# Patient Record
Sex: Male | Born: 1975 | Race: White | Hispanic: No | Marital: Married | Smoking: Never smoker
Health system: Southern US, Community
[De-identification: ages and names within clinical notes are randomized; demographics above are authoritative.]

## PROBLEM LIST (undated history)

## (undated) DIAGNOSIS — E785 Hyperlipidemia, unspecified: Secondary | ICD-10-CM

## (undated) DIAGNOSIS — I10 Essential (primary) hypertension: Secondary | ICD-10-CM

---

## 2008-10-13 ENCOUNTER — Ambulatory Visit: Payer: Self-pay | Admitting: Occupational Medicine

## 2008-10-13 DIAGNOSIS — L03019 Cellulitis of unspecified finger: Secondary | ICD-10-CM | POA: Insufficient documentation

## 2015-09-10 ENCOUNTER — Encounter: Payer: Self-pay | Admitting: Emergency Medicine

## 2015-09-10 ENCOUNTER — Emergency Department (INDEPENDENT_AMBULATORY_CARE_PROVIDER_SITE_OTHER)
Admission: EM | Admit: 2015-09-10 | Discharge: 2015-09-10 | Disposition: A | Discharge status: Home / Self Care | Payer: BLUE CROSS/BLUE SHIELD | Source: Home / Self Care | Attending: Family Medicine | Admitting: Family Medicine

## 2015-09-10 DIAGNOSIS — J069 Acute upper respiratory infection, unspecified: Secondary | ICD-10-CM

## 2015-09-10 MED ORDER — AZITHROMYCIN 250 MG PO TABS: 250.0000 mg | ORAL_TABLET | Freq: Every day | ORAL | Status: DC

## 2015-09-10 NOTE — Discharge Instructions (Signed)
You may take 400-629m Ibuprofen (Motrin) every 6-8 hours for fever and pain  Alternate with Tylenol  You may take 5037mTylenol every 4-6 hours as needed for fever and pain  Follow-up with your primary care provider next week for recheck of symptoms if not improving.  Be sure to drink plenty of fluids and rest, at least 8hrs of sleep a night, preferably more while you are sick. Return urgent care or go to closest ER if you cannot keep down fluids/signs of dehydration, fever not reducing with Tylenol, difficulty breathing/wheezing, stiff neck, worsening condition, or other concerns (see below)  Please take antibiotics as prescribed and be sure to complete entire course even if you start to feel better to ensure infection does not come back.   Cool Mist Vaporizers Vaporizers may help relieve the symptoms of a cough and cold. They add moisture to the air, which helps mucus to become thinner and less sticky. This makes it easier to breathe and cough up secretions. Cool mist vaporizers do not cause serious burns like hot mist vaporizers, which may also be called steamers or humidifiers. Vaporizers have not been proven to help with colds. You should not use a vaporizer if you are allergic to mold. HOME CARE INSTRUCTIONS  Follow the package instructions for the vaporizer.  Do not use anything other than distilled water in the vaporizer.  Do not run the vaporizer all of the time. This can cause mold or bacteria to grow in the vaporizer.  Clean the vaporizer after each time it is used.  Clean and dry the vaporizer well before storing it.  Stop using the vaporizer if worsening respiratory symptoms develop.   This information is not intended to replace advice given to you by your health care provider. Make sure you discuss any questions you have with your health care provider.   Document Released: 03/15/2004 Document Revised: 06/23/2013 Document Reviewed: 11/05/2012 Elsevier Interactive Patient  Education 2016 Else[employer]  Upper Respiratory Infection, Adult Most upper respiratory infections (URIs) are a viral infection of the air passages leading to the lungs. A URI affects the nose, throat, and upper air passages. The most common type of URI is nasopharyngitis and is typically referred to as "the common cold." URIs run their course and usually go away on their own. Most of the time, a URI does not require medical attention, but sometimes a bacterial infection in the upper airways can follow a viral infection. This is called a secondary infection. Sinus and middle ear infections are common types of secondary upper respiratory infections. Bacterial pneumonia can also complicate a URI. A URI can worsen asthma and chronic obstructive pulmonary disease (COPD). Sometimes, these complications can require emergency medical care and may be life threatening.  CAUSES Almost all URIs are caused by viruses. A virus is a type of germ and can spread from one person to another.  RISKS FACTORS You may be at risk for a URI if:   You smoke.   You have chronic heart or lung disease.  You have a weakened defense (immune) system.   You are very young or very old.   You have nasal allergies or asthma.  You work in crowded or poorly ventilated areas.  You work in health care facilities or schools. SIGNS AND SYMPTOMS  Symptoms typically develop 2-3 days after you come in contact with a cold virus. Most viral URIs last 7-10 days. However, viral URIs from the influenza virus (flu virus) can last 14-18 days  and are typically more severe. Symptoms may include:   Runny or stuffy (congested) nose.   Sneezing.   Cough.   Sore throat.   Headache.   Fatigue.   Fever.   Loss of appetite.   Pain in your forehead, behind your eyes, and over your cheekbones (sinus pain).  Muscle aches.  DIAGNOSIS  Your health care provider may diagnose a URI by:  Physical exam.  Tests to check  that your symptoms are not due to another condition such as:  Strep throat.  Sinusitis.  Pneumonia.  Asthma. TREATMENT  A URI goes away on its own with time. It cannot be cured with medicines, but medicines may be prescribed or recommended to relieve symptoms. Medicines may help:  Reduce your fever.  Reduce your cough.  Relieve nasal congestion. HOME CARE INSTRUCTIONS   Take medicines only as directed by your health care provider.   Gargle warm saltwater or take cough drops to comfort your throat as directed by your health care provider.  Use a warm mist humidifier or inhale steam from a shower to increase air moisture. This may make it easier to breathe.  Drink enough fluid to keep your urine clear or pale yellow.   Eat soups and other clear broths and maintain good nutrition.   Rest as needed.   Return to work when your temperature has returned to normal or as your health care provider advises. You may need to stay home longer to avoid infecting others. You can also use a face mask and careful hand washing to prevent spread of the virus.  Increase the usage of your inhaler if you have asthma.   Do not use any tobacco products, including cigarettes, chewing tobacco, or electronic cigarettes. If you need help quitting, ask your health care provider. PREVENTION  The best way to protect yourself from getting a cold is to practice good hygiene.   Avoid oral or hand contact with people with cold symptoms.   Wash your hands often if contact occurs.  There is no clear evidence that vitamin C, vitamin E, echinacea, or exercise reduces the chance of developing a cold. However, it is always recommended to get plenty of rest, exercise, and practice good nutrition.  SEEK MEDICAL CARE IF:   You are getting worse rather than better.   Your symptoms are not controlled by medicine.   You have chills.  You have worsening shortness of breath.  You have brown or red  mucus.  You have yellow or brown nasal discharge.  You have pain in your face, especially when you bend forward.  You have a fever.  You have swollen neck glands.  You have pain while swallowing.  You have white areas in the back of your throat. SEEK IMMEDIATE MEDICAL CARE IF:   You have severe or persistent:  Headache.  Ear pain.  Sinus pain.  Chest pain.  You have chronic lung disease and any of the following:  Wheezing.  Prolonged cough.  Coughing up blood.  A change in your usual mucus.  You have a stiff neck.  You have changes in your:  Vision.  Hearing.  Thinking.  Mood. MAKE SURE YOU:   Understand these instructions.  Will watch your condition.  Will get help right away if you are not doing well or get worse.   This information is not intended to replace advice given to you by your health care provider. Make sure you discuss any questions you have with your  health care provider.   Document Released: 12/12/2000 Document Revised: 11/02/2014 Document Reviewed: 09/23/2013 Elsevier Interactive Patient Education [employer].

## 2015-09-10 NOTE — ED Notes (Signed)
Patient presents to [facility] with C/O cough times one week with generalized weakness. Denies fever.

## 2015-09-10 NOTE — ED Provider Notes (Signed)
CSN: [id number]     Arrival date & time 09/10/15  1713 History   First MD Initiated Contact with Patient 09/10/15 1743     Chief Complaint  Patient presents with  . Cough   (Consider location/radiation/quality/duration/timing/severity/associated sxs/prior Treatment) HPI  The pt is a 40yo male presenting to [facility] with c/o worsening dry hacking cough with generalized weakness. Weakness and fatigue most bothersome for the pt.  He has been taking OTC medications and symptoms seemed to resolved but then cough and weakness worsened. Denies fever, chills, n/v/d.  History reviewed. No pertinent past medical history. History reviewed. No pertinent past surgical history. History reviewed. No pertinent family history. Social History  Substance Use Topics  . Smoking status: Never Smoker   . Smokeless tobacco: Never Used  . Alcohol Use: Yes     Comment: 2/4 per week    Review of Systems  Constitutional: Positive for fatigue. Negative for fever and chills.  HENT: Positive for congestion. Negative for ear pain, sore throat, trouble swallowing and voice change.   Respiratory: Positive for cough, shortness of breath and wheezing.   Cardiovascular: Negative for chest pain and palpitations.  Gastrointestinal: Negative for nausea, vomiting, abdominal pain and diarrhea.  Musculoskeletal: Positive for myalgias and arthralgias. Negative for back pain.  Skin: Negative for rash.  Neurological: Positive for dizziness and weakness. Negative for headaches.    Allergies  Review of patient's allergies indicates not on file.  Home Medications   Prior to Admission medications   Medication Sig Start Date End Date Taking? Authorizing Provider  azithromycin (ZITHROMAX) 250 MG tablet Take 1 tablet (250 mg total) by mouth daily. Take first 2 tablets together, then 1 every day until finished. 09/10/15   Noland Fordyce, PA-C   Meds Ordered and Administered this Visit  Medications - No data to display  BP 139/89  mmHg  Pulse 53  Temp(Src) 98.2 F (36.8 C) (Oral)  Resp 16  Ht 5' 10"  (1.778 m)  Wt 187 lb 8 oz (85.049 kg)  BMI 26.90 kg/m2 No data found.   Physical Exam  Constitutional: He appears well-developed and well-nourished.  HENT:  Head: Normocephalic and atraumatic.  Right Ear: Tympanic membrane normal.  Left Ear: Tympanic membrane normal.  Nose: Rhinorrhea present.  Mouth/Throat: Uvula is midline, oropharynx is clear and moist and mucous membranes are normal.  Eyes: Conjunctivae are normal. No scleral icterus.  Neck: Normal range of motion. Neck supple.  Cardiovascular: Normal rate, regular rhythm and normal heart sounds.   Pulmonary/Chest: Effort normal and breath sounds normal. No respiratory distress. He has no wheezes. He has no rales. He exhibits no tenderness.  Abdominal: Soft. He exhibits no distension. There is no tenderness.  Musculoskeletal: Normal range of motion.  Neurological: He is alert.  Skin: Skin is warm and dry.  Nursing note and vitals reviewed.   ED Course  Procedures (including critical care time)  Labs Review Labs Reviewed - No data to display  Imaging Review No results found.   MDM   1. Acute upper respiratory infection    Pt c/o worsening dry hacking cough and worsening fatigue.    With worsening hacking cough, will treat for atypical bacteria. Rx: azithromycin. Pt has cough medication at home.  Advised pt to use acetaminophen and ibuprofen as needed for fever and pain. Encouraged rest and fluids. F/u with PCP in 7-10 days if not improving, sooner if worsening. Pt verbalized understanding and agreement with tx plan.   Noland Fordyce, PA-C 09/11/15 [phone]

## 2016-08-09 ENCOUNTER — Emergency Department (INDEPENDENT_AMBULATORY_CARE_PROVIDER_SITE_OTHER)
Admission: EM | Admit: 2016-08-09 | Discharge: 2016-08-09 | Disposition: A | Discharge status: Home / Self Care | Payer: BLUE CROSS/BLUE SHIELD | Source: Home / Self Care | Attending: Family Medicine | Admitting: Family Medicine

## 2016-08-09 ENCOUNTER — Encounter: Payer: Self-pay | Admitting: Emergency Medicine

## 2016-08-09 DIAGNOSIS — J028 Acute pharyngitis due to other specified organisms: Secondary | ICD-10-CM

## 2016-08-09 DIAGNOSIS — J111 Influenza due to unidentified influenza virus with other respiratory manifestations: Secondary | ICD-10-CM

## 2016-08-09 DIAGNOSIS — H109 Unspecified conjunctivitis: Secondary | ICD-10-CM

## 2016-08-09 DIAGNOSIS — R69 Illness, unspecified: Secondary | ICD-10-CM

## 2016-08-09 LAB — POCT RAPID STREP A (OFFICE): Rapid Strep A Screen: NEGATIVE

## 2016-08-09 MED ORDER — GUAIFENESIN-CODEINE 100-10 MG/5ML PO SOLN: ORAL | 0 refills | Status: DC

## 2016-08-09 MED ORDER — PENICILLIN V POTASSIUM 500 MG PO TABS: ORAL_TABLET | ORAL | 0 refills | Status: DC

## 2016-08-09 NOTE — ED Provider Notes (Signed)
[name]    CSN: [id number] Arrival date & time: 08/09/16  0856     History   Chief Complaint Chief Complaint  Patient presents with  . Conjunctivitis    HPI Ivan Russell is a 41 y.o. male.   Patient developed mild URI symptoms six days ago that became acutely worse about four days ago with myalgias, chills/sweats, fatigue.  He participated in a medical e-visit and started on Tamiflu.  Three days ago he developed right eye redness, drainage, and swelling of the eyelids each morning all of which improved when he began using his wife's left-over Polymyxin B eye drops.  He has had sinus congestion, and just developed increasing cough during the past two days.   The history is provided by the patient.    History reviewed. No pertinent past medical history.  Patient Active Problem List   Diagnosis Date Noted  . 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 No pertinent surgical history.     Home Medications    Prior to Admission medications   Medication Sig Start Date End Date Taking? Authorizing Provider  oseltamivir (TAMIFLU) 75 MG capsule Take 75 mg by mouth.   Yes Historical Provider, MD  trimethoprim-polymyxin b (POLYTRIM) ophthalmic solution every 4 (four) hours.   Yes Historical Provider, MD  guaiFENesin-codeine 100-10 MG/5ML syrup Take 9m by mouth at bedtime as needed for cough 08/09/16   SKandra Nicolas MD  penicillin v potassium (VEETID) 500 MG tablet Take one tab by mouth twice daily for 10 days 08/09/16   SKandra Nicolas MD    Family History No family history on file.  Social History Social History  Substance Use Topics  . Smoking status: Never Smoker  . Smokeless tobacco: Never Used  . Alcohol use Yes     Comment: 2/4 per week     Allergies   Patient has no allergy information on record.   Review of Systems Review of Systems + sore throat + cough No pleuritic pain No wheezing + nasal congestion + post-nasal  drainage No sinus pain/pressure + itchy/red right eye No earache No hemoptysis No SOB + fever, + chills No nausea No vomiting No abdominal pain No diarrhea No urinary symptoms No skin rash + fatigue + myalgias No headache Used OTC meds without relief   Physical Exam Triage Vital Signs ED Triage Vitals  Enc Vitals Group     BP 08/09/16 0931 147/88     Pulse Rate 08/09/16 0931 62     Resp --      Temp 08/09/16 0931 97.9 F (36.6 C)     Temp Source 08/09/16 0931 Oral     SpO2 08/09/16 0931 96 %     Weight 08/09/16 0931 194 lb (88 kg)     Height 08/09/16 0931 5' 9"  (1.753 m)     Head Circumference --      Peak Flow --      Pain Score 08/09/16 0934 1     Pain Loc --      Pain Edu? --      Excl. in [place] --    No data found.   Updated Vital Signs BP 147/88 (BP Location: Left Arm)   Pulse 62   Temp 97.9 F (36.6 C) (Oral)   Ht 5' 9"  (1.753 m)   Wt 194 lb (88 kg)   SpO2 96%   BMI 28.65 kg/m   Visual Acuity Right Eye Distance:   Left Eye  Distance:   Bilateral Distance:    Right Eye Near:   Left Eye Near:    Bilateral Near:     Physical Exam Nursing notes and Vital Signs reviewed. Appearance:  Patient appears stated age, and in no acute distress Eyes:  Pupils are equal, round, and reactive to light and accomodation.  Extraocular movement is intact.  Right conjunctivae are minimally injected.  No discharge.  No eyelid swelling or tenderness to palpation.  Left conjunctivae negative.  No photophobia. Ears:  Canals normal.  Tympanic membranes normal.  Nose:  Mildly congested turbinates.  No sinus tenderness.   Pharynx:  Erythematous and slightly swollen without obstruction.  Uvula also erythematous and swollen.  No exudate. Neck:  Supple.  Tender enlarged tonsillar nodes are palpated bilaterally  Lungs:  Clear to auscultation.  Breath sounds are equal.  Moving air well. Heart:  Regular rate and rhythm without murmurs, rubs, or gallops.  Abdomen:  Nontender  without masses or hepatosplenomegaly.  Bowel sounds are present.  No CVA or flank tenderness.  Extremities:  No edema.  Skin:  No rash present.    UC Treatments / Results  Labs (all labs ordered are listed, but only abnormal results are displayed) Labs Reviewed  POCT RAPID STREP A (OFFICE) negative    EKG  EKG Interpretation None       Radiology No results found.  Procedures Procedures (including critical care time)  Medications Ordered in UC Medications - No data to display   Initial Impression / Assessment and Plan / UC Course  I have reviewed the triage vital signs and the nursing notes.  Pertinent labs & imaging results that were available during my care of the patient were reviewed by me and considered in my medical decision making (see chart for details).    Conjunctivitis right eye improving. ?false negative rapid strep; will check throat culture and begin empiric PenVK. Rx for Robitussin AC for night time cough.  Continue and finish Tamiflu.  Continue Polymyxin/trimethoprim eye drops to right eye every 3 to 4 hours. Begin warm compresses to right eye several times daily. Take plain guaifenesin (1269m extended release tabs such as Mucinex) twice daily, with plenty of water, for cough and congestion.  Get adequate rest.   May use Afrin nasal spray (or generic oxymetazoline) twice daily for about 5 days and then discontinue.  Also recommend using saline nasal spray several times daily and saline nasal irrigation (AYR is a common brand).  Use Flonase nasal spray each morning after using Afrin nasal spray and saline nasal irrigation. Try warm salt water gargles for sore throat.  Stop all antihistamines for now, and other non-prescription cough/cold preparations. May take Ibuprofen 2036m 4 tabs every 8 hours with food for sore throat, etc.   Follow-up with family doctor if not improving about one week.    Final Clinical Impressions(s) / UC Diagnoses   Final  diagnoses:  Pharyngitis due to other organism  Conjunctivitis of right eye, unspecified conjunctivitis type  Influenza-like illness    New Prescriptions New Prescriptions   GUAIFENESIN-CODEINE 100-10 MG/5ML SYRUP    Take 1047my mouth at bedtime as needed for cough   PENICILLIN V POTASSIUM (VEETID) 500 MG TABLET    Take one tab by mouth twice daily for 10 days     SteKandra NicolasD 08/09/16 1034

## 2016-08-09 NOTE — ED Triage Notes (Signed)
Rt eye red, matted in the morning x 3 days. His wife had Polymyxin B Sulfate drops from when she had pink eye that he started using yesterday. He is taking Tamiflu since Monday.

## 2016-08-09 NOTE — Discharge Instructions (Signed)
Continue and finish Tamiflu.  Continue Polymyxin/trimethoprim eye drops to right eye every 3 to 4 hours. Begin warm compresses to right eye several times daily. Take plain guaifenesin (125m extended release tabs such as Mucinex) twice daily, with plenty of water, for cough and congestion.  Get adequate rest.   May use Afrin nasal spray (or generic oxymetazoline) twice daily for about 5 days and then discontinue.  Also recommend using saline nasal spray several times daily and saline nasal irrigation (AYR is a common brand).  Use Flonase nasal spray each morning after using Afrin nasal spray and saline nasal irrigation. Try warm salt water gargles for sore throat.  Stop all antihistamines for now, and other non-prescription cough/cold preparations. May take Ibuprofen 2063m 4 tabs every 8 hours with food for sore throat, etc.   Follow-up with family doctor if not improving about one week.

## 2016-08-10 ENCOUNTER — Telehealth: Payer: Self-pay | Admitting: Emergency Medicine

## 2016-08-10 LAB — STREP A DNA PROBE
GASP: DETECTED
GASP: DETECTED

## 2016-08-10 NOTE — Telephone Encounter (Signed)
Spoke with patient who is already feeling a little better; he is on PCN and the STrep DNA was positive so encouraged him to complete the course.

## 2016-11-10 ENCOUNTER — Emergency Department (INDEPENDENT_AMBULATORY_CARE_PROVIDER_SITE_OTHER)
Admission: EM | Admit: 2016-11-10 | Discharge: 2016-11-10 | Disposition: A | Discharge status: Home / Self Care | Payer: BLUE CROSS/BLUE SHIELD | Source: Home / Self Care | Attending: Family Medicine | Admitting: Family Medicine

## 2016-11-10 ENCOUNTER — Encounter: Payer: Self-pay | Admitting: Emergency Medicine

## 2016-11-10 DIAGNOSIS — R197 Diarrhea, unspecified: Secondary | ICD-10-CM | POA: Diagnosis not present

## 2016-11-10 HISTORY — DX: Essential (primary) hypertension: I10

## 2016-11-10 HISTORY — DX: Hyperlipidemia, unspecified: E78.5

## 2016-11-10 MED ORDER — IBUPROFEN 600 MG PO TABS
600.0000 mg | ORAL_TABLET | Freq: Once | ORAL | Status: AC
  Administered 2016-11-10: 600 mg via ORAL

## 2016-11-10 NOTE — ED Triage Notes (Signed)
Patient reports onset of general aches, diarrhea and sense of chills last night. No OTCs today. Denies respiratory symptoms, nausea, vomiting.

## 2016-11-10 NOTE — Discharge Instructions (Signed)
Begin clear liquids (Pedialyte while having diarrhea) until improved, then advance to a [employer] (Bananas, Rice, Applesauce, Toast).  Then gradually resume a regular diet when tolerated.  Avoid milk products until well.  When stools become more formed, may take Imodium (loperamide) once or twice daily to decrease stool frequency.  If symptoms become significantly worse during the night or over the weekend, proceed to the local emergency room.

## 2016-11-10 NOTE — ED Provider Notes (Signed)
[facility]    CSN: [id number] Arrival date & time: 11/10/16  1049     History   Chief Complaint Chief Complaint  Patient presents with  . Chills  . Generalized Body Aches  . Diarrhea    HPI Ivan Russell is a 41 y.o. male.   Patient reports that he felt "weak" yesterday morning, and later developed mild nausea without vomiting.  By midday he had developed stomach cramps and watery diarrhea.  Last night he developed chills and myalgias.  Denies recent foreign travel, or drinking untreated water in a wilderness environment.  He denies recent antibiotic use.  He states that his wife has had a similar illness.   The history is provided by the patient.  Diarrhea  Quality:  Watery Severity:  Mild Onset quality:  Sudden Duration:  1 day Timing:  Sporadic Progression:  Improving Relieved by:  None tried Worsened by:  Nothing Ineffective treatments:  None tried Associated symptoms: chills and myalgias   Associated symptoms: no abdominal pain, no arthralgias, no recent cough, no diaphoresis, no fever, no headaches, no URI and no vomiting   Risk factors: sick contacts and suspect food intake   Risk factors: no recent antibiotic use     Past Medical History:  Diagnosis Date  . Hyperlipidemia   . Hypertension     Patient Active Problem List   Diagnosis Date Noted  . PARONYCHIA, FINGER 10/13/2008    No past surgical history on file.     Home Medications    Prior to Admission medications   Medication Sig Start Date End Date Taking? Authorizing Provider  lisinopril (PRINIVIL,ZESTRIL) 10 MG tablet Take 10 mg by mouth daily.   Yes [provider]  pravastatin (PRAVACHOL) 20 MG tablet Take 20 mg by mouth daily.   Yes [provider]    Family History No family history on file.  Social History Social History  Substance Use Topics  . Smoking status: Never Smoker  . Smokeless tobacco: Never Used  . Alcohol use Yes     Comment: 2/4  per week     Allergies   Patient has no known allergies.   Review of Systems Review of Systems  Constitutional: Positive for chills. Negative for diaphoresis and fever.  Gastrointestinal: Positive for diarrhea. Negative for abdominal pain and vomiting.  Musculoskeletal: Positive for myalgias. Negative for arthralgias.  Neurological: Negative for headaches.  All other systems reviewed and are negative.    Physical Exam Triage Vital Signs ED Triage Vitals  Enc Vitals Group     BP 11/10/16 1106 119/82     Pulse Rate 11/10/16 1106 86     Resp 11/10/16 1106 16     Temp 11/10/16 1106 98.6 F (37 C)     Temp Source 11/10/16 1106 Oral     SpO2 --      Weight 11/10/16 1107 190 lb (86.2 kg)     Height 11/10/16 1107 5' 9"  (1.753 m)     Head Circumference --      Peak Flow --      Pain Score 11/10/16 1107 1     Pain Loc --      Pain Edu? --      Excl. in [place]? --    No data found.   Updated Vital Signs BP 119/82 (BP Location: Left Arm)   Pulse 86   Temp 98.6 F (37 C) (Oral)   Resp 16   Ht 5' 9"  (1.753 m)  Wt 190 lb (86.2 kg)   BMI 28.06 kg/m   Visual Acuity Right Eye Distance:   Left Eye Distance:   Bilateral Distance:    Right Eye Near:   Left Eye Near:    Bilateral Near:     Physical Exam Nursing notes and Vital Signs reviewed. Appearance:  Patient appears stated age, and in no acute distress.    Eyes:  Pupils are equal, round, and reactive to light and accomodation.  Extraocular movement is intact.  Conjunctivae are not inflamed   Pharynx:  Normal; moist mucous membranes  Neck:  Supple.  No adenopathy Lungs:  Clear to auscultation.  Breath sounds are equal.  Moving air well. Heart:  Regular rate and rhythm without murmurs, rubs, or gallops.  Abdomen:  Nontender without masses or hepatosplenomegaly.  Bowel sounds are present.  No CVA or flank tenderness.  Extremities:  No edema.  Skin:  No rash present.     UC Treatments / Results  Labs (all labs  ordered are listed, but only abnormal results are displayed) Labs Reviewed - No data to display  EKG  EKG Interpretation None       Radiology No results found.  Procedures Procedures (including critical care time)  Medications Ordered in UC Medications  ibuprofen (ADVIL,MOTRIN) tablet 600 mg (600 mg Oral Given 11/10/16 1114)     Initial Impression / Assessment and Plan / UC Course  I have reviewed the triage vital signs and the nursing notes.  Pertinent labs & imaging results that were available during my care of the patient were reviewed by me and considered in my medical decision making (see chart for details).    Suspect viral gastroenteritis Begin clear liquids (Pedialyte while having diarrhea) until improved, then advance to a [employer] (Bananas, Rice, Applesauce, Toast).  Then gradually resume a regular diet when tolerated.  Avoid milk products until well.  When stools become more formed, may take Imodium (loperamide) once or twice daily to decrease stool frequency.  If symptoms become significantly worse during the night or over the weekend, proceed to the local emergency room.  Followup with Family Doctor if not improved in about 4 days.    Final Clinical Impressions(s) / UC Diagnoses   Final diagnoses:  Diarrhea of presumed infectious origin    New Prescriptions Current Discharge Medication List       Kandra Nicolas, MD 11/20/16 1215

## 2017-09-27 ENCOUNTER — Other Ambulatory Visit: Payer: Self-pay

## 2017-09-27 ENCOUNTER — Emergency Department (INDEPENDENT_AMBULATORY_CARE_PROVIDER_SITE_OTHER)
Admission: EM | Admit: 2017-09-27 | Discharge: 2017-09-27 | Disposition: A | Discharge status: Home / Self Care | Payer: BLUE CROSS/BLUE SHIELD | Source: Home / Self Care | Attending: Emergency Medicine | Admitting: Emergency Medicine

## 2017-09-27 ENCOUNTER — Emergency Department (INDEPENDENT_AMBULATORY_CARE_PROVIDER_SITE_OTHER): Payer: BLUE CROSS/BLUE SHIELD

## 2017-09-27 DIAGNOSIS — R05 Cough: Secondary | ICD-10-CM | POA: Diagnosis not present

## 2017-09-27 DIAGNOSIS — J302 Other seasonal allergic rhinitis: Secondary | ICD-10-CM | POA: Diagnosis not present

## 2017-09-27 DIAGNOSIS — R0602 Shortness of breath: Secondary | ICD-10-CM

## 2017-09-27 DIAGNOSIS — R059 Cough, unspecified: Secondary | ICD-10-CM

## 2017-09-27 MED ORDER — FLUTICASONE PROPIONATE 50 MCG/ACT NA SUSP: 2.0000 | Freq: Every day | NASAL | 2 refills | Status: DC

## 2017-09-27 MED ORDER — PREDNISONE 10 MG (21) PO TBPK: ORAL_TABLET | ORAL | 0 refills | Status: DC

## 2017-09-27 MED ORDER — BENZONATATE 100 MG PO CAPS: 100.0000 mg | ORAL_CAPSULE | Freq: Three times a day (TID) | ORAL | 0 refills | Status: DC | PRN

## 2017-09-27 NOTE — Discharge Instructions (Addendum)
Use Flonase every day. Take Tessalon Perles as needed for cough. Take prednisone taper over the next 6 days. Try and stay away from pollen . Consider changing your BP med if cough is persistent.

## 2017-09-27 NOTE — ED Provider Notes (Addendum)
[name]    CSN: [id number] Arrival date & time: 09/27/17  1056     History   Chief Complaint Chief Complaint  Patient presents with  . Cough  . Headache  . Nasal Congestion  . Fatigue    HPI Ivan Russell is a 42 y.o. male.  Patient has a history of cough nasal congestion headache and fatigue for 1 week.  He states his mother was diagnosed with flu a week ago but no testing was done.  He continues to have symptoms primarily of headache, head congestion, fatigue, and lightheadedness as well as cough and sneezing.  He does have a history of allergies. HPI  Past Medical History:  Diagnosis Date  . Hyperlipidemia   . Hypertension     Patient Active Problem List   Diagnosis Date Noted  . 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 No pertinent surgical history.     Home Medications    Prior to Admission medications   Medication Sig Start Date End Date Taking? Authorizing Provider  pravastatin (PRAVACHOL) 40 MG tablet take 1 tablet by mouth EVERY EVENING 03/29/17  Yes [provider]  benzonatate (TESSALON) 100 MG capsule Take 1-2 capsules (100-200 mg total) by mouth 3 (three) times daily as needed for cough. 09/27/17   Darlyne Russian, MD  fluticasone (FLONASE) 50 MCG/ACT nasal spray Place 2 sprays into both nostrils daily. 09/27/17   Darlyne Russian, MD  lisinopril (PRINIVIL,ZESTRIL) 10 MG tablet Take 10 mg by mouth daily.    [provider]  omeprazole (PRILOSEC OTC) 20 MG tablet Take by mouth.    [provider]  pravastatin (PRAVACHOL) 20 MG tablet Take 20 mg by mouth daily.    [provider]  predniSONE (STERAPRED UNI-PAK 21 TAB) 10 MG (21) TBPK tablet 6 a day for 1 day 5 a day for 1 day 4 a day for 1 day 3 a day for 1 day 2 a day for 1 day 1 a day for 1 day 09/27/17   Darlyne Russian, MD    Family History History reviewed. No pertinent family history.  Social History Social History   Tobacco Use  .  Smoking status: Never Smoker  . Smokeless tobacco: Never Used  Substance Use Topics  . Alcohol use: Yes    Comment: 2/4 per week  . Drug use: Never     Allergies   Patient has no known allergies.   Review of Systems Review of Systems  Constitutional: Positive for fatigue. Negative for fever.  HENT: Positive for postnasal drip and sinus pain.   Eyes: Negative.   Respiratory: Positive for cough.   Cardiovascular: Negative.      Physical Exam Triage Vital Signs ED Triage Vitals  Enc Vitals Group     BP 09/27/17 1129 (!) 134/92     Pulse Rate 09/27/17 1129 76     Resp --      Temp 09/27/17 1129 98.9 F (37.2 C)     Temp Source 09/27/17 1129 Oral     SpO2 09/27/17 1129 96 %     Weight 09/27/17 1130 199 lb (90.3 kg)     Height 09/27/17 1130 5' 9"  (1.753 m)     Head Circumference --      Peak Flow --      Pain Score 09/27/17 1129 0     Pain Loc --      Pain Edu? --      Excl.  in [place]? --    No data found.  Updated Vital Signs BP (!) 134/92 (BP Location: Right Arm)   Pulse 76   Temp 98.9 F (37.2 C) (Oral)   Ht 5' 9"  (1.753 m)   Wt 199 lb (90.3 kg)   SpO2 96%   BMI 29.39 kg/m   Visual Acuity Right Eye Distance:   Left Eye Distance:   Bilateral Distance:    Right Eye Near:   Left Eye Near:    Bilateral Near:     Physical Exam  Constitutional: He appears well-developed and well-nourished.  HENT:  Head: Normocephalic and atraumatic.  Mouth/Throat: Oropharynx is clear and moist.  Nose is congested.  The posterior pharynx is unremarkable without redness or drainage.  Cardiovascular: Normal rate and regular rhythm.  Pulmonary/Chest: Effort normal and breath sounds normal. He has no wheezes. He has no rales.  Abdominal: Soft.  Skin: Skin is warm and dry.     UC Treatments / Results  Labs (all labs ordered are listed, but only abnormal results are displayed) Labs Reviewed - No data to display  EKG None Radiology Dg Chest 2 View  Result Date:  09/27/2017 CLINICAL DATA:  Cough.  Shortness of breath.  Sinus congestion. EXAM: CHEST - 2 VIEW COMPARISON:  No prior. FINDINGS: Mediastinum and hilar structures normal. Lungs are clear. No pleural effusion or pneumothorax. Heart size normal. No acute bony abnormality. IMPRESSION: No acute cardiopulmonary disease. Electronically Signed   By: Marcello Moores  Register   On: 09/27/2017 12:36    Procedures Procedures (including critical care time)  Medications Ordered in UC Medications - No data to display   Initial Impression / Assessment and Plan / UC Course  I have reviewed the triage vital signs and the nursing notes.  Pertinent labs & imaging results that were available during my care of the patient were reviewed by me and considered in my medical decision making (see chart for details). We will check a chest x-ray today because of the duration of symptoms.  I suspect most of the symptoms are allergy related and will proceed with prednisone and cough suppressant as well as Flonase.  He is a non-smoker and has not had a productive cough.       Final Clinical Impressions(s) / UC Diagnoses   Final diagnoses:  Seasonal allergies  Cough    ED Discharge Orders        Ordered    fluticasone (FLONASE) 50 MCG/ACT nasal spray  Daily     09/27/17 1248    benzonatate (TESSALON) 100 MG capsule  3 times daily PRN     09/27/17 1248    predniSONE (STERAPRED UNI-PAK 21 TAB) 10 MG (21) TBPK tablet     09/27/17 1248     Use Flonase every day. Take Tessalon Perles as needed for cough. Take prednisone taper over the next 6 days. Try and stay away from pollen exposure.  Controlled Substance Prescriptions [place] Controlled Substance Registry consulted? Not Applicable   Darlyne Russian, MD 09/27/17 1254    Darlyne Russian, MD 09/27/17 1255

## 2017-09-27 NOTE — ED Triage Notes (Signed)
Pt stated sx started last weekend, is very fatigued all week. Cough, nasal congestion, denies fever

## 2018-02-27 ENCOUNTER — Encounter: Payer: Self-pay | Admitting: Family Medicine

## 2018-02-27 ENCOUNTER — Emergency Department (INDEPENDENT_AMBULATORY_CARE_PROVIDER_SITE_OTHER)
Admission: EM | Admit: 2018-02-27 | Discharge: 2018-02-27 | Disposition: A | Discharge status: Home / Self Care | Payer: BLUE CROSS/BLUE SHIELD | Source: Home / Self Care

## 2018-02-27 ENCOUNTER — Other Ambulatory Visit: Payer: Self-pay

## 2018-02-27 DIAGNOSIS — S39012A Strain of muscle, fascia and tendon of lower back, initial encounter: Secondary | ICD-10-CM | POA: Diagnosis not present

## 2018-02-27 MED ORDER — CYCLOBENZAPRINE HCL 5 MG PO TABS: 5.0000 mg | ORAL_TABLET | Freq: Every day | ORAL | 0 refills | Status: DC

## 2018-02-27 MED ORDER — PREDNISONE 10 MG (21) PO TBPK: ORAL_TABLET | ORAL | 0 refills | Status: DC

## 2018-02-27 NOTE — ED Provider Notes (Signed)
[facility]    CSN: [id number] Arrival date & time: 02/27/18  1816     History   Chief Complaint Chief Complaint  Patient presents with  . Back Pain    HPI Ivan Russell is a 42 y.o. male.   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  He has had problems with his back in the past and he anticipates some lifting in the near future as well as camping this weekend.  He has had no fever, bowel or bladder incontinence, fever, weakness in his legs, radicular pain in his legs, or numbness.  He tried jogging a few days ago and that seemed to aggravate the pain but he has been able to do elliptical machines without problem.     Past Medical History:  Diagnosis Date  . Hyperlipidemia   . Hypertension     Patient Active Problem List   Diagnosis Date Noted  . 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 No pertinent surgical history.     Home Medications    Prior to Admission medications   Medication Sig Start Date End Date Taking? Authorizing Provider  cyclobenzaprine (FLEXERIL) 5 MG tablet Take 1 tablet (5 mg total) by mouth at bedtime. 02/27/18   Robyn Haber, MD  lisinopril (PRINIVIL,ZESTRIL) 10 MG tablet Take 10 mg by mouth daily.    [provider]  omeprazole (PRILOSEC OTC) 20 MG tablet Take by mouth.    [provider]  predniSONE (STERAPRED UNI-PAK 21 TAB) 10 MG (21) TBPK tablet 6 a day for 1 day 5 a day for 1 day 4 a day for 1 day 3 a day for 1 day 2 a day for 1 day 1 a day for 1 day 02/27/18   Robyn Haber, MD    Family History No family history on file.  Social History Social History   Tobacco Use  . Smoking status: Never Smoker  . Smokeless tobacco: Never Used  Substance Use Topics  . Alcohol use: Yes    Comment: 2/4 per week  . Drug use: Never     Allergies     Patient has no known allergies.   Review of Systems Review of Systems  Constitutional: Negative.   Gastrointestinal: Negative.   Genitourinary: Negative.   Musculoskeletal: Positive for back pain.  Neurological: Negative.      Physical Exam Triage Vital Signs ED Triage Vitals  Enc Vitals Group     BP      Pulse      Resp      Temp      Temp src      SpO2      Weight      Height      Head Circumference      Peak Flow      Pain Score      Pain Loc      Pain Edu?      Excl. in [place]?    No data found.  Updated Vital Signs BP 132/79 (BP Location: Right Arm)   Pulse (!) 50   Temp 98.3 F (36.8 C) (Oral)   Resp 16   Ht 5' 9.5" (1.765 m)   Wt 90.7 kg   SpO2 98%   BMI 29.11 kg/m    Physical Exam  Constitutional: He is oriented to person, place,  and time. He appears well-developed and well-nourished.  HENT:  Right Ear: External ear normal.  Left Ear: External ear normal.  Eyes: Pupils are equal, round, and reactive to light. Conjunctivae are normal.  Neck: Normal range of motion. Neck supple.  Pulmonary/Chest: Effort normal.  Musculoskeletal: Normal range of motion.  Straight leg raising is normal There is no muscle atrophy or asymmetry  Neurological: He is alert and oriented to person, place, and time. He displays normal reflexes. No cranial nerve deficit. He exhibits normal muscle tone. Coordination normal.  Skin: Skin is warm and dry.  Psychiatric: He has a normal mood and affect.  Nursing note and vitals reviewed.    UC Treatments / Results  Labs (all labs ordered are listed, but only abnormal results are displayed) Labs Reviewed - No data to display  EKG None  Radiology No results found.  Procedures Procedures (including critical care time)  Medications Ordered in UC Medications - No data to display  Initial Impression / Assessment and Plan / UC Course  I have reviewed the triage vital signs and the nursing notes.  Pertinent labs &  imaging results that were available during my care of the patient were reviewed by me and considered in my medical decision making (see chart for details).    Final Clinical Impressions(s) / UC Diagnoses   Final diagnoses:  Strain of lumbar region, initial encounter     Discharge Instructions     Have your wife help you with straight leg stretches and rotational stretches. Continue the elliptical...avoiding the jogging for 10 days Try to find a wide velcro back brace for times when you are lifting heavy items.    ED Prescriptions    Medication Sig Dispense Auth. Provider   predniSONE (STERAPRED UNI-PAK 21 TAB) 10 MG (21) TBPK tablet 6 a day for 1 day 5 a day for 1 day 4 a day for 1 day 3 a day for 1 day 2 a day for 1 day 1 a day for 1 day 21 tablet Lulie Hurd, Synetta Shadow, MD   cyclobenzaprine (FLEXERIL) 5 MG tablet Take 1 tablet (5 mg total) by mouth at bedtime. 7 tablet Robyn Haber, MD     Controlled Substance Prescriptions [place] Controlled Substance Registry consulted? Not Applicable   Robyn Haber, MD 02/27/18 [phone]

## 2018-02-27 NOTE — ED Triage Notes (Signed)
Patient has had pain in lower back for about 2 weeks; may be related to digging holes in ground; mostly painful upon changing positions; took ibuprofen 63m at 1530 today.

## 2018-02-27 NOTE — Discharge Instructions (Addendum)
Have your wife help you with straight leg stretches and rotational stretches. Continue the elliptical...avoiding the jogging for 10 days Try to find a wide velcro back brace for times when you are lifting heavy items.

## 2018-09-06 ENCOUNTER — Encounter: Payer: Self-pay | Admitting: Emergency Medicine

## 2018-09-06 ENCOUNTER — Other Ambulatory Visit: Payer: Self-pay

## 2018-09-06 ENCOUNTER — Emergency Department (INDEPENDENT_AMBULATORY_CARE_PROVIDER_SITE_OTHER)
Admission: EM | Admit: 2018-09-06 | Discharge: 2018-09-06 | Disposition: A | Discharge status: Home / Self Care | Payer: BLUE CROSS/BLUE SHIELD | Source: Home / Self Care

## 2018-09-06 DIAGNOSIS — M5441 Lumbago with sciatica, right side: Secondary | ICD-10-CM | POA: Diagnosis not present

## 2018-09-06 MED ORDER — TIZANIDINE HCL 4 MG PO TABS: 4.0000 mg | ORAL_TABLET | Freq: Four times a day (QID) | ORAL | 0 refills | Status: DC | PRN

## 2018-09-06 NOTE — ED Triage Notes (Signed)
Here with 2 weeks constant right leg pain. Reports Hx lower back from previous strain. Pain is constant; decrease with movement. Taking Naprosyn, Tizanidine and OTC Ibuprofen for relief. 8/10 pain

## 2018-09-06 NOTE — ED Provider Notes (Signed)
[facility]    CSN: [id number] Arrival date & time: 09/06/18  0947     History   Chief Complaint Chief Complaint  Patient presents with  . Leg Pain    Right    HPI Ivan Russell is a 43 y.o. male.   HPI  Ivan Russell is a 43 y.o. male presenting to UC with c/o 2 weeks lower back pain that radiates down his Right leg.  Hx of low back pain in the past but never radiating down his leg.  Pain is worse with certain movements or in certain positions including prolonged sitting.  He recalls seeing an ad for a thin wallet to place in front pocket instead of putting a large wallet in the back pocket to help reduce low back pain. He notes the next day, after moving his wallet from his back pocket to the front is when his leg pain started. He has tried stretching, ice, naproxen and previously prescribed Zanaflex with temporary relief.  Denies weakness in his legs or groin. No urinary symptoms. No known injury. Denies leg swelling, redness or bruising. No hx of blood clots.   Past Medical History:  Diagnosis Date  . Hyperlipidemia   . Hypertension     Patient Active Problem List   Diagnosis Date Noted  . 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 No pertinent surgical history.     Home Medications    Prior to Admission medications   Medication Sig Start Date End Date Taking? Authorizing Provider  cyclobenzaprine (FLEXERIL) 5 MG tablet Take 1 tablet (5 mg total) by mouth at bedtime. 02/27/18   Robyn Haber, MD  lisinopril (PRINIVIL,ZESTRIL) 10 MG tablet Take 10 mg by mouth daily.    [provider]  omeprazole (PRILOSEC OTC) 20 MG tablet Take by mouth.    [provider]  predniSONE (STERAPRED UNI-PAK 21 TAB) 10 MG (21) TBPK tablet 6 a day for 1 day 5 a day for 1 day 4 a day for 1 day 3 a day for 1 day 2 a day for 1 day 1 a day for 1 day 02/27/18   Robyn Haber, MD  tiZANidine (ZANAFLEX) 4 MG tablet Take 1 tablet (4 mg total) by mouth  every 6 (six) hours as needed for muscle spasms. 09/06/18   Noe Gens, PA-C    Family History History reviewed. No pertinent family history.  Social History Social History   Tobacco Use  . Smoking status: Never Smoker  . Smokeless tobacco: Never Used  Substance Use Topics  . Alcohol use: Yes    Comment: 2/4 per week  . Drug use: Never     Allergies   Patient has no known allergies.   Review of Systems Review of Systems  Musculoskeletal: Positive for back pain and myalgias. Negative for arthralgias and gait problem.  Skin: Negative for color change and rash.  Neurological: Positive for numbness (occasionaly tingling in Right leg). Negative for weakness.     Physical Exam Triage Vital Signs ED Triage Vitals  Enc Vitals Group     BP 09/06/18 1017 (!) 153/93     Pulse Rate 09/06/18 1017 (!) 52     Resp --      Temp 09/06/18 1017 98.3 F (36.8 C)     Temp Source 09/06/18 1017 Oral     SpO2 09/06/18 1017 96 %     Weight 09/06/18 1018 202 lb (91.6 kg)     Height 09/06/18  1018 5' 9"  (1.753 m)     Head Circumference --      Peak Flow --      Pain Score 09/06/18 1017 7     Pain Loc --      Pain Edu? --      Excl. in [place]? --    No data found.  Updated Vital Signs BP (!) 153/93 (BP Location: Left Arm)   Pulse (!) 52   Temp 98.3 F (36.8 C) (Oral)   Ht 5' 9"  (1.753 m)   Wt 202 lb (91.6 kg)   SpO2 96%   BMI 29.83 kg/m   Visual Acuity Right Eye Distance:   Left Eye Distance:   Bilateral Distance:    Right Eye Near:   Left Eye Near:    Bilateral Near:     Physical Exam Vitals signs and nursing note reviewed.  Constitutional:      Appearance: He is well-developed.  HENT:     Head: Normocephalic and atraumatic.  Neck:     Musculoskeletal: Normal range of motion.  Cardiovascular:     Rate and Rhythm: Normal rate.  Pulmonary:     Effort: Pulmonary effort is normal.  Musculoskeletal: Normal range of motion.        General: Tenderness present.      Comments: No spinal tenderness. Mild tenderness to Right buttock and posterior thigh. Full ROM hip, knee and ankle w/o crepitus. No bony tenderness. Calf is soft, non-tender.  Skin:    General: Skin is warm and dry.  Neurological:     Mental Status: He is alert and oriented to person, place, and time.  Psychiatric:        Behavior: Behavior normal.      UC Treatments / Results  Labs (all labs ordered are listed, but only abnormal results are displayed) Labs Reviewed - No data to display  EKG None  Radiology No results found.  Procedures Procedures (including critical care time)  Medications Ordered in UC Medications - No data to display  Initial Impression / Assessment and Plan / UC Course  I have reviewed the triage vital signs and the nursing notes.  Pertinent labs & imaging results that were available during my care of the patient were reviewed by me and considered in my medical decision making (see chart for details).    Hx and exam c/w sciatica Zanaflex refilled AVS provided for home exercise tx F/u with Sports Med as needed  Final Clinical Impressions(s) / UC Diagnoses   Final diagnoses:  Acute right-sided low back pain with right-sided sciatica     Discharge Instructions      You may continue to take your previously prescribed medication for pain. Please see this information packet for home exercises to help with your pain. If not improving in 1-2 weeks, it is recommended you be evaluated by a Sports Medicine provider or orthopedist.     ED Prescriptions    Medication Sig Dispense Auth. Provider   tiZANidine (ZANAFLEX) 4 MG tablet Take 1 tablet (4 mg total) by mouth every 6 (six) hours as needed for muscle spasms. 30 tablet Noe Gens, PA-C     Controlled Substance Prescriptions [place] Controlled Substance Registry consulted? Not Applicable   Tyrell Antonio 09/06/18 1240

## 2018-09-06 NOTE — Discharge Instructions (Signed)
°  You may continue to take your previously prescribed medication for pain. Please see this information packet for home exercises to help with your pain. If not improving in 1-2 weeks, it is recommended you be evaluated by a Sports Medicine provider or orthopedist.

## 2018-09-07 ENCOUNTER — Telehealth: Payer: Self-pay | Admitting: Emergency Medicine

## 2018-09-07 MED ORDER — CYCLOBENZAPRINE HCL 10 MG PO TABS: 10.0000 mg | ORAL_TABLET | Freq: Two times a day (BID) | ORAL | 0 refills | Status: DC | PRN

## 2018-09-07 NOTE — Telephone Encounter (Signed)
Pt called asking to change muscle spasm med to Flexeril for better relief. Per Provider, new rx Flexeril sent to pharmacy.

## 2018-09-24 ENCOUNTER — Other Ambulatory Visit: Payer: Self-pay

## 2018-09-24 ENCOUNTER — Ambulatory Visit (INDEPENDENT_AMBULATORY_CARE_PROVIDER_SITE_OTHER): Payer: BLUE CROSS/BLUE SHIELD

## 2018-09-24 ENCOUNTER — Ambulatory Visit (INDEPENDENT_AMBULATORY_CARE_PROVIDER_SITE_OTHER): Payer: BLUE CROSS/BLUE SHIELD | Admitting: Sports Medicine

## 2018-09-24 DIAGNOSIS — M5116 Intervertebral disc disorders with radiculopathy, lumbar region: Secondary | ICD-10-CM | POA: Diagnosis not present

## 2018-09-24 DIAGNOSIS — M5416 Radiculopathy, lumbar region: Secondary | ICD-10-CM | POA: Diagnosis not present

## 2018-09-24 IMAGING — DX LUMBAR SPINE - COMPLETE 4+ VIEW
6 series · 6 of 6 positions shown · non-contrast
Comparison: None.

CLINICAL DATA: Right leg pain for over 2 months.

EXAM:
LUMBAR SPINE - COMPLETE 4+ VIEW

[l-spine ap (1 of 2)]
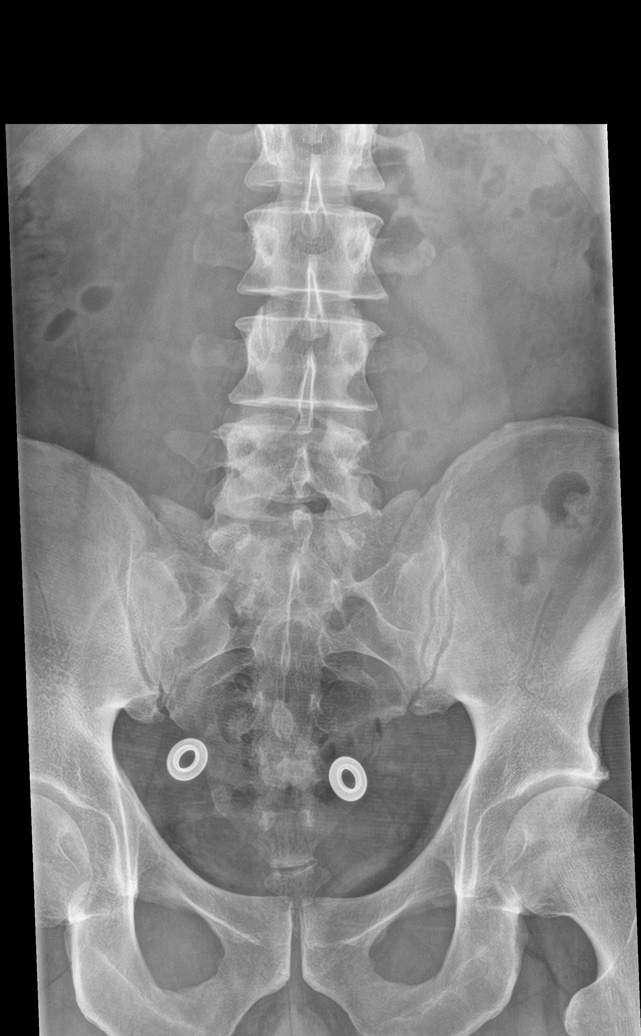

[l-spine obl (1 of 2)]
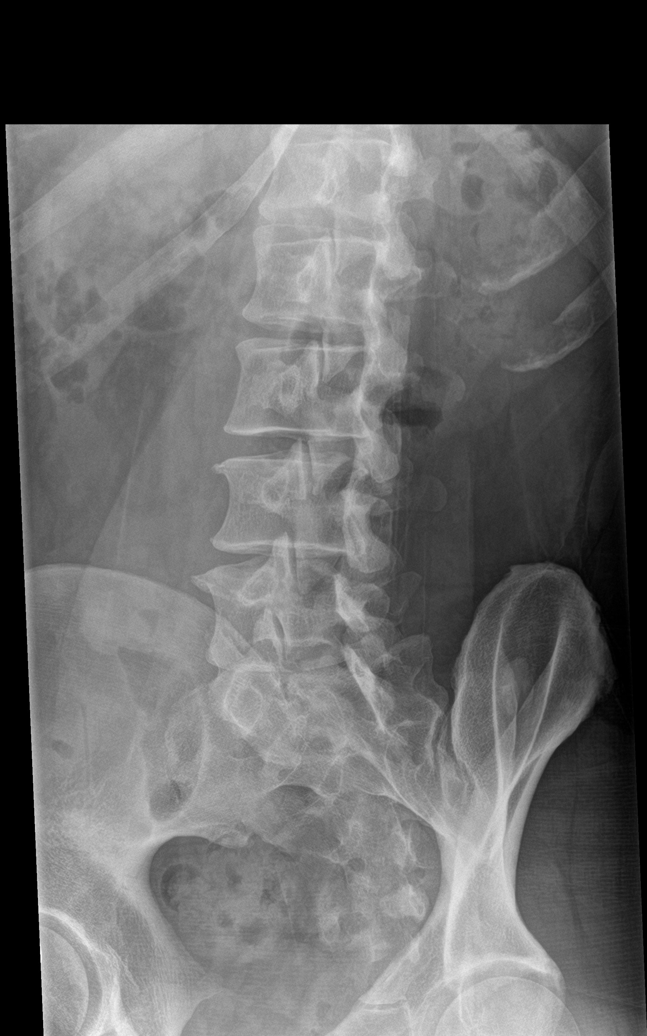

[l-spine obl (2 of 2)]
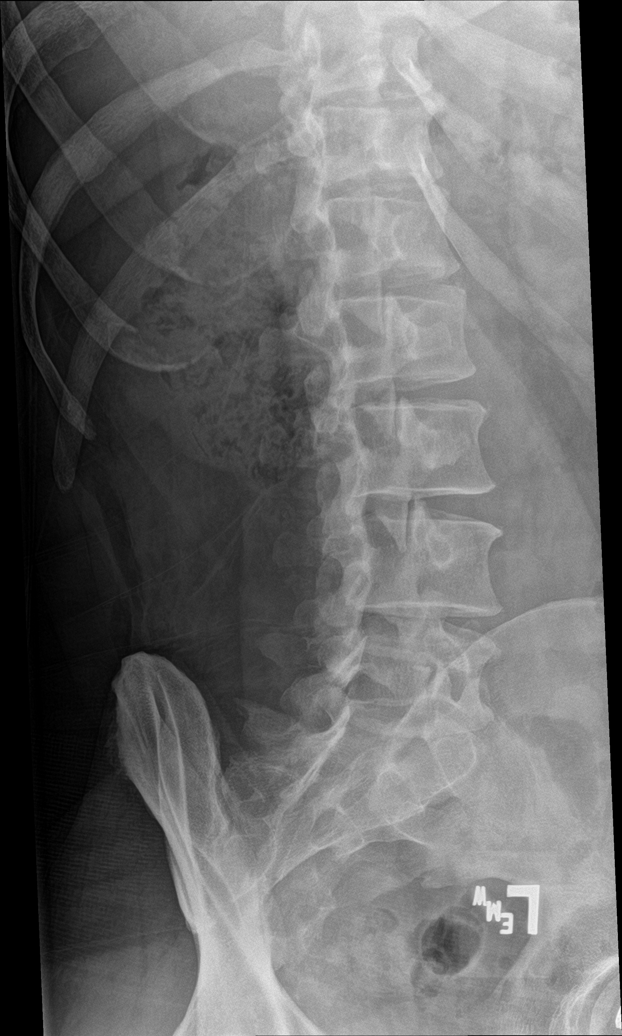

[l-spine lat]
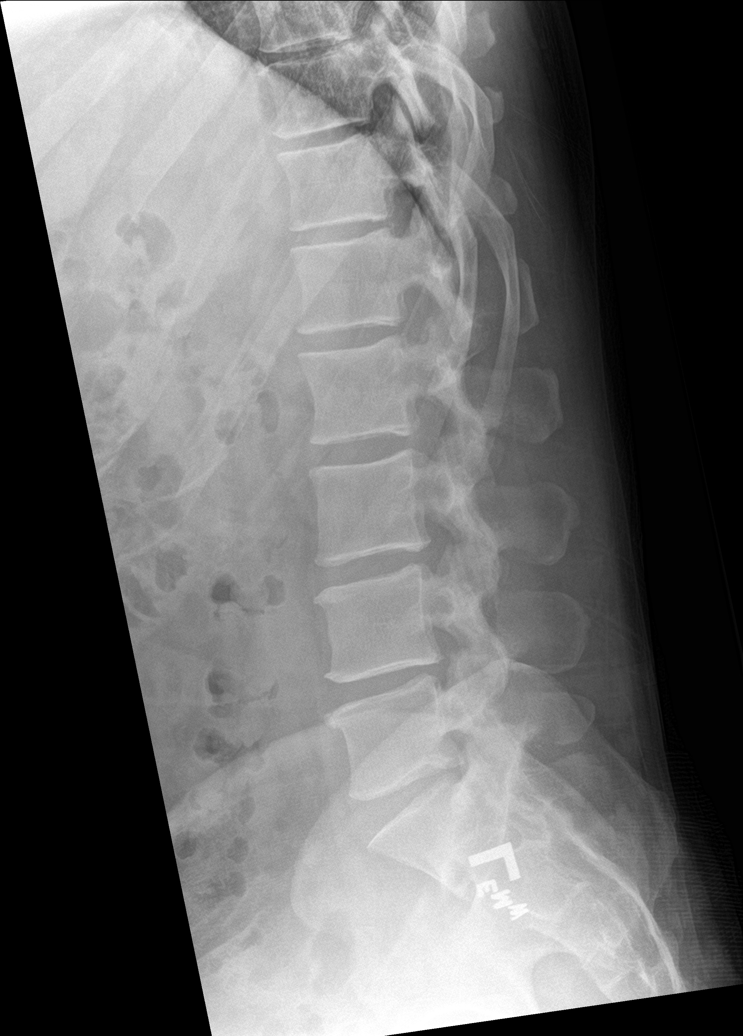

[l-spine spot]
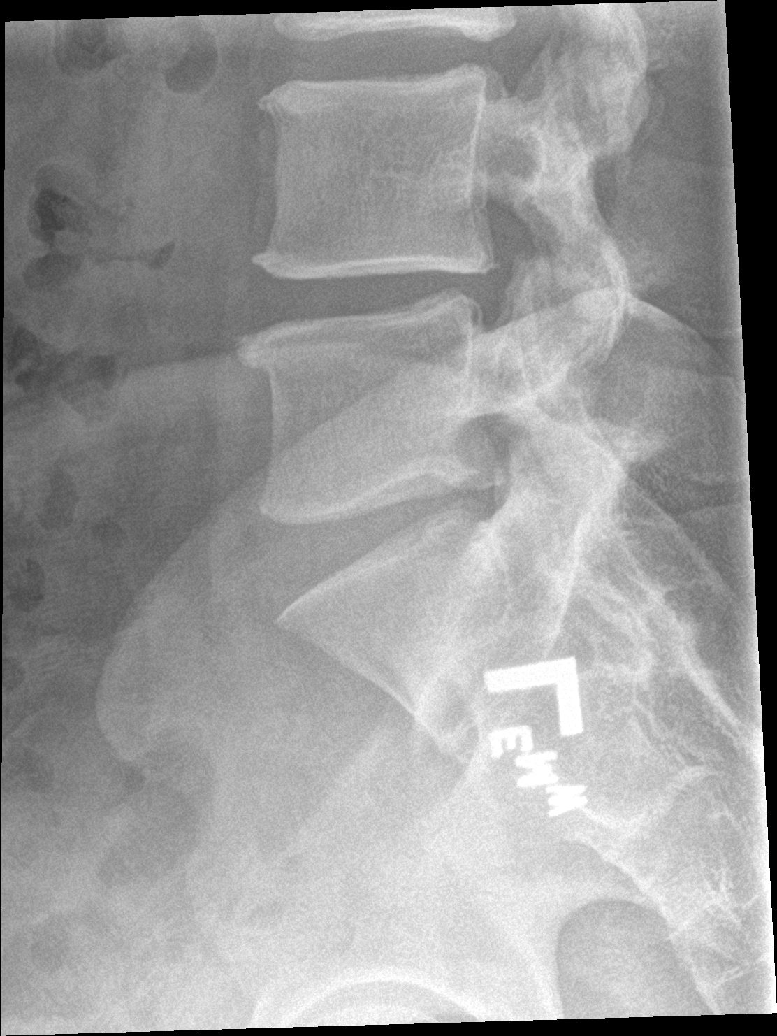

[l-spine ap (2 of 2)]
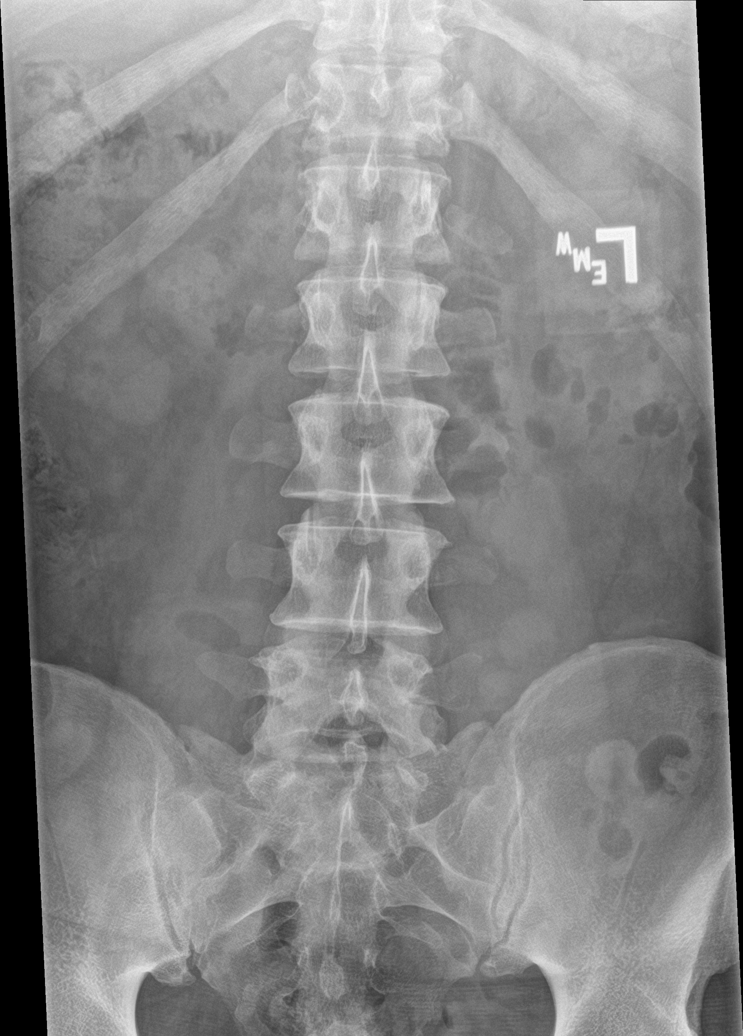

[6 of 6 positions shown; findings below may reference images not displayed]

FINDINGS: There is no evidence of lumbar spine fracture. Alignment is normal.
Minimal degenerative disc disease at L3-L4 and L4-L5.
IMPRESSION: Minimal degenerative disc disease at L3-L4 and L4-L5.

## 2018-09-24 MED ORDER — PREDNISONE 50 MG PO TABS: ORAL_TABLET | ORAL | 0 refills | Status: DC

## 2018-09-24 NOTE — Progress Notes (Signed)
Subjective:    I'm seeing this patient as a consultation for: Worthy Keeler, PA-C.  Daleen Snook, NP  CC: Back and leg pain  HPI: For months this pleasant 43 year old male has had pain in his low back with radiation down the back of the right leg, causing numbness and tingling as well as pain into the entire foot.  Symptoms are severe, persistent, no bowel or bladder dysfunction, saddle numbness, no constitutional symptoms, no progressive weakness.  I reviewed the past medical history, family history, social history, surgical history, and allergies today and no changes were needed.  Please see the problem list section below in epic for further details.  Past Medical History: Past Medical History:  Diagnosis Date  . Hyperlipidemia   . Hypertension    Past Surgical History: No past surgical history on file. Social History: Social History   Socioeconomic History  . Marital status: Married    Spouse name: Not on file  . Number of children: Not on file  . Years of education: Not on file  . Highest education level: Not on file  Occupational History  . Not on file  Social Needs  . Financial resource strain: Not on file  . Food insecurity:    Worry: Not on file    Inability: Not on file  . Transportation needs:    Medical: Not on file    Non-medical: Not on file  Tobacco Use  . Smoking status: Never Smoker  . Smokeless tobacco: Never Used  Substance and Sexual Activity  . Alcohol use: Yes    Comment: 2/4 per week  . Drug use: Never  . Sexual activity: Not on file  Lifestyle  . Physical activity:    Days per week: Not on file    Minutes per session: Not on file  . Stress: Not on file  Relationships  . Social connections:    Talks on phone: Not on file    Gets together: Not on file    Attends religious service: Not on file    Active member of club or organization: Not on file    Attends meetings of clubs or organizations: Not on file    Relationship status: Not on  file  Other Topics Concern  . Not on file  Social History Narrative  . Not on file   Family History: No family history on file. Allergies: No Known Allergies Medications: See med rec.  Review of Systems: No headache, visual changes, nausea, vomiting, diarrhea, constipation, dizziness, abdominal pain, skin rash, fevers, chills, night sweats, weight loss, swollen lymph nodes, body aches, joint swelling, muscle aches, chest pain, shortness of breath, mood changes, visual or auditory hallucinations.   Objective:   General: Well Developed, well nourished, and in no acute distress.  Neuro:  Extra-ocular muscles intact, able to move all 4 extremities, sensation grossly intact.  Deep tendon reflexes tested were normal. Psych: Alert and oriented, mood congruent with affect. ENT:  Ears and nose appear unremarkable.  Hearing grossly normal. Neck: Unremarkable overall appearance, trachea midline.  No visible thyroid enlargement. Eyes: Conjunctivae and lids appear unremarkable.  Pupils equal and round. Skin: Warm and dry, no rashes noted.  Cardiovascular: Pulses palpable, no extremity edema. Back Exam:  Inspection: Unremarkable  Motion: Flexion 45 deg, Extension 45 deg, Side Bending to 45 deg bilaterally,  Rotation to 45 deg bilaterally  SLR laying: Negative  XSLR laying: Negative  Palpable tenderness: None. FABER: negative. Sensory change: Gross sensation intact to all lumbar and  sacral dermatomes.  Reflexes: 2+ at both patellar tendons, 2+ at achilles tendons, Babinski's downgoing.  Strength at foot  Plantar-flexion: 5/5 Dorsi-flexion: 5/5 Eversion: 5/5 Inversion: 5/5  Leg strength  Quad: 5/5 Hamstring: 5/5 Hip flexor: 5/5 Hip abductors: 5/5  Gait unremarkable.  Lumbar spine x-rays personally reviewed, there is minimal L3-L5 DDD  Impression and Recommendations:   This case required medical decision making of moderate complexity.  Right lumbar radiculitis Right L5 distribution  radiculitis without radiculopathy. Prednisone, formal physical therapy, x-rays. Symptoms have been present for several months now. Return to see me in 6 weeks, MR for interventional planning if no better.   ___________________________________________ Gwen Her. Dianah Field, M.D., ABFM., CAQSM. Primary Care and Sports Medicine [facility]  Adjunct Professor of [place] [employer] [facility] [employer]

## 2018-09-24 NOTE — Patient Instructions (Signed)
Radicular Pain Radicular pain is a type of pain that spreads from your back or neck along a spinal nerve. Spinal nerves are nerves that leave the spinal cord and go to the muscles. Radicular pain is sometimes called radiculopathy, radiculitis, or a pinched nerve. When you have this type of pain, you may also have weakness, numbness, or tingling in the area of your body that is supplied by the nerve. The pain may feel sharp and burning. Depending on which spinal nerve is affected, the pain may occur in the:  Neck area (cervical radicular pain). You may also feel pain, numbness, weakness, or tingling in the arms.  Mid-spine area (thoracic radicular pain). You would feel this pain in the back and chest. This type is rare.  Lower back area (lumbar radicular pain). You would feel this pain as low back pain. You may feel pain, numbness, weakness, or tingling in the buttocks or legs. Sciatica is a type of lumbar radicular pain that shoots down the back of the leg. Radicular pain occurs when one of the spinal nerves becomes irritated or squeezed (compressed). It is often caused by something pushing on a spinal nerve, such as one of the bones of the spine (vertebrae) or one of the round cushions between vertebrae (intervertebral disks). This can result from:  An injury.  Wear and tear or aging of a disk.  The growth of a bone spur that pushes on the nerve. Radicular pain often goes away when you follow instructions from your health care provider for relieving pain at home. Follow these instructions at home: Managing pain      If directed, put ice on the affected area: ? Put ice in a plastic bag. ? Place a towel between your skin and the bag. ? Leave the ice on for 20 minutes, 2-3 times a day.  If directed, apply heat to the affected area as often as told by your health care provider. Use the heat source that your health care provider recommends, such as a moist heat pack or a heating pad. ? Place  a towel between your skin and the heat source. ? Leave the heat on for 20-30 minutes. ? Remove the heat if your skin turns bright red. This is especially important if you are unable to feel pain, heat, or cold. You may have a greater risk of getting burned. Activity   Do not sit or rest in bed for long periods of time.  Try to stay as active as possible. Ask your health care provider what type of exercise or activity is best for you.  Avoid activities that make your pain worse, such as bending and lifting.  Do not lift anything that is heavier than 10 lb (4.5 kg), or the limit that you are told, until your health care provider says that it is safe.  Practice using proper technique when lifting items. Proper lifting technique involves bending your knees and rising up.  Do strength and range-of-motion exercises only as told by your health care provider or physical therapist. General instructions  Take over-the-counter and prescription medicines only as told by your health care provider.  Pay attention to any changes in your symptoms.  Keep all follow-up visits as told by your health care provider. This is important. ? Your health care provider may send you to a physical therapist to help with this pain. Contact a health care provider if:  Your pain and other symptoms get worse.  Your pain medicine is not  helping.  Your pain has not improved after a few weeks of home care.  You have a fever. Get help right away if:  You have severe pain, weakness, or numbness.  You have difficulty with bladder or bowel control. Summary  Radicular pain is a type of pain that spreads from your back or neck along a spinal nerve.  When you have radicular pain, you may also have weakness, numbness, or tingling in the area of your body that is supplied by the nerve.  The pain may feel sharp or burning.  Radicular pain may be treated with ice, heat, medicines, or physical therapy. This  information is not intended to replace advice given to you by your health care provider. Make sure you discuss any questions you have with your health care provider. Document Released: 07/26/2004 Document Revised: 12/31/2017 Document Reviewed: 12/31/2017 Elsevier Interactive Patient Education  2019 [employer].

## 2018-09-24 NOTE — Assessment & Plan Note (Signed)
Right L5 distribution radiculitis without radiculopathy. Prednisone, formal physical therapy, x-rays. Symptoms have been present for several months now. Return to see me in 6 weeks, MR for interventional planning if no better.

## 2018-11-05 ENCOUNTER — Encounter: Payer: Self-pay | Admitting: Sports Medicine

## 2018-11-05 ENCOUNTER — Ambulatory Visit (INDEPENDENT_AMBULATORY_CARE_PROVIDER_SITE_OTHER): Payer: BLUE CROSS/BLUE SHIELD | Admitting: Sports Medicine

## 2018-11-05 DIAGNOSIS — M5416 Radiculopathy, lumbar region: Secondary | ICD-10-CM | POA: Diagnosis not present

## 2018-11-05 NOTE — Assessment & Plan Note (Addendum)
Persistent right L5 distribution radiculitis, no radiculopathy, axial back pain. Discogenic. Has failed greater than 6 weeks of physician directed conservative measures, proceeding with MRI for interventional planning. I am happy to order his epidural once we see the MRI results, anticipate right L5-S1 transforaminal.  There does appear to be compression of the right S1 nerve root, ordering a selective right S1 epidural.

## 2018-11-05 NOTE — Progress Notes (Addendum)
Subjective:    CC: Follow-up  HPI: This is a pleasant 43 year old male with right L5 radiculitis, he did well initially with conservative measures, therapy, but unfortunately continues to have on and off persistent back pain with radiation down the leg in an L5 distribution.  He has no bowel or bladder dysfunction, saddle numbness, no constitutional symptoms, and is agreeable to proceed with the next step.  I reviewed the past medical history, family history, social history, surgical history, and allergies today and no changes were needed.  Please see the problem list section below in epic for further details.  Past Medical History: Past Medical History:  Diagnosis Date  . Hyperlipidemia   . Hypertension    Past Surgical History: No past surgical history on file. Social History: Social History   Socioeconomic History  . Marital status: Married    Spouse name: Not on file  . Number of children: Not on file  . Years of education: Not on file  . Highest education level: Not on file  Occupational History  . Not on file  Social Needs  . Financial resource strain: Not on file  . Food insecurity:    Worry: Not on file    Inability: Not on file  . Transportation needs:    Medical: Not on file    Non-medical: Not on file  Tobacco Use  . Smoking status: Never Smoker  . Smokeless tobacco: Never Used  Substance and Sexual Activity  . Alcohol use: Yes    Comment: 2/4 per week  . Drug use: Never  . Sexual activity: Not on file  Lifestyle  . Physical activity:    Days per week: Not on file    Minutes per session: Not on file  . Stress: Not on file  Relationships  . Social connections:    Talks on phone: Not on file    Gets together: Not on file    Attends religious service: Not on file    Active member of club or organization: Not on file    Attends meetings of clubs or organizations: Not on file    Relationship status: Not on file  Other Topics Concern  . Not on file   Social History Narrative  . Not on file   Family History: No family history on file. Allergies: No Known Allergies Medications: See med rec.  Review of Systems: No fevers, chills, night sweats, weight loss, chest pain, or shortness of breath.   Objective:    General: Well Developed, well nourished, and in no acute distress.  Neuro: Alert and oriented x3, extra-ocular muscles intact, sensation grossly intact.  HEENT: Normocephalic, atraumatic, pupils equal round reactive to light, neck supple, no masses, no lymphadenopathy, thyroid nonpalpable.  Skin: Warm and dry, no rashes. Cardiac: Regular rate and rhythm, no murmurs rubs or gallops, no lower extremity edema.  Respiratory: Clear to auscultation bilaterally. Not using accessory muscles, speaking in full sentences.  Impression and Recommendations:    Right lumbar radiculitis Persistent right L5 distribution radiculitis, no radiculopathy, axial back pain. Discogenic. Has failed greater than 6 weeks of physician directed conservative measures, proceeding with MRI for interventional planning. I am happy to order his epidural once we see the MRI results, anticipate right L5-S1 transforaminal.  There does appear to be compression of the right S1 nerve root, ordering a selective right S1 epidural.  I spent 25 minutes with this patient, greater than 50% was face-to-face time counseling regarding the above diagnoses.  ___________________________________________ Gwen Her.  Dianah Field, M.D., ABFM., CAQSM. Primary Care and Sports Medicine [facility]  Adjunct Professor of [place] [employer] of Medicine

## 2018-11-06 ENCOUNTER — Ambulatory Visit: Payer: BLUE CROSS/BLUE SHIELD | Admitting: Sports Medicine

## 2018-11-06 ENCOUNTER — Telehealth: Payer: Self-pay

## 2018-11-06 DIAGNOSIS — M5416 Radiculopathy, lumbar region: Secondary | ICD-10-CM

## 2018-11-06 NOTE — Telephone Encounter (Signed)
Pt advised. No further needs

## 2018-11-06 NOTE — Telephone Encounter (Signed)
Patient left msg wanting to know Dr Mcneil Sober opinion on at-home decompression techniques, specifically inversion tablets. States he has a friend who let him borrow one and wanted Dr Mcneil Sober opinion prior to committing to one. Please advise

## 2018-11-06 NOTE — Telephone Encounter (Signed)
Inversion tables are essentially traction devices, these do provide good short-term relief, so if he can get a hold of 1 he should use it as often as he can stand.

## 2018-11-07 NOTE — Addendum Note (Signed)
Addended by: Silverio Decamp on: 11/07/2018 01:41 PM   Modules accepted: Orders

## 2018-11-10 ENCOUNTER — Other Ambulatory Visit: Payer: Self-pay

## 2018-11-10 ENCOUNTER — Ambulatory Visit (INDEPENDENT_AMBULATORY_CARE_PROVIDER_SITE_OTHER): Payer: BLUE CROSS/BLUE SHIELD

## 2018-11-10 DIAGNOSIS — M5416 Radiculopathy, lumbar region: Secondary | ICD-10-CM | POA: Diagnosis not present

## 2018-11-11 NOTE — Addendum Note (Signed)
Addended by: Silverio Decamp on: 11/11/2018 12:41 PM   Modules accepted: Orders

## 2018-11-14 ENCOUNTER — Telehealth: Payer: Self-pay | Admitting: Sports Medicine

## 2018-11-14 NOTE — Telephone Encounter (Addendum)
Dr. Francesco Runner is going to be the soonest available anywhere around here.  They should call him back and see if they can be double booked somewhere sooner.

## 2018-11-14 NOTE — Telephone Encounter (Signed)
See other note

## 2018-11-14 NOTE — Telephone Encounter (Signed)
Patient's wife requested a second opinion and wants a referral sent to   [facility].   Wife Expressed urgency due to insurance dropping in 5 weeks.  Contact her with any issues at   [facility] (Spo)  Showing 1 of 1    [phone]

## 2018-11-14 NOTE — Telephone Encounter (Signed)
Pt's wife advised- states she has friends who work at [facility] and they told her she could get patient in sooner there since they will restart on 11/17/18. She is requesting order to be placed for there.

## 2018-11-14 NOTE — Addendum Note (Signed)
Addended by: Silverio Decamp on: 11/14/2018 06:07 PM   Modules accepted: Orders

## 2018-11-14 NOTE — Telephone Encounter (Signed)
Pt's wife called to advise that Pt was scheduled for his epidural injection on 12/05/18 with Dr Francesco Runner. They are worried he will lose his job/insurance before then. They would like to know where else they can do. She would like callback with update: [phone].

## 2018-11-14 NOTE — Telephone Encounter (Signed)
Orders placed for [facility], please contact them for scheduling.

## 2018-11-14 NOTE — Telephone Encounter (Signed)
Left VM for Pt's wife with status update

## 2018-11-17 NOTE — Telephone Encounter (Signed)
Called and spoke with Ivan Russell. She is calling pt to schedule

## 2018-11-25 ENCOUNTER — Other Ambulatory Visit: Payer: Self-pay

## 2018-11-25 ENCOUNTER — Ambulatory Visit
Admission: RE | Admit: 2018-11-25 | Discharge: 2018-11-25 | Disposition: A | Discharge status: Home / Self Care | Payer: BLUE CROSS/BLUE SHIELD | Source: Ambulatory Visit | Attending: Sports Medicine | Admitting: Sports Medicine

## 2018-11-25 MED ORDER — METHYLPREDNISOLONE ACETATE 40 MG/ML INJ SUSP (RADIOLOG
120.0000 mg | Freq: Once | INTRAMUSCULAR | Status: AC
  Administered 2018-11-25: 120 mg via EPIDURAL

## 2018-11-25 MED ORDER — IOPAMIDOL (ISOVUE-M 200) INJECTION 41%
1.0000 mL | Freq: Once | INTRAMUSCULAR | Status: AC
  Administered 2018-11-25: 1 mL via EPIDURAL

## 2018-11-25 NOTE — Discharge Instructions (Signed)

## 2018-12-01 ENCOUNTER — Other Ambulatory Visit: Payer: BLUE CROSS/BLUE SHIELD

## 2018-12-18 ENCOUNTER — Telehealth: Payer: Self-pay | Admitting: Physician Assistant

## 2018-12-18 DIAGNOSIS — M5416 Radiculopathy, lumbar region: Secondary | ICD-10-CM

## 2018-12-18 NOTE — Telephone Encounter (Signed)
Left msg with Angelita Ingles to schedule

## 2018-12-18 NOTE — Telephone Encounter (Signed)
Pt called. First injection did not work. 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  Thanks.

## 2018-12-18 NOTE — Assessment & Plan Note (Signed)
Initial selective S1 epidural did not help. I would like to proceed this time with a less selective right L5-S1 interlaminar epidural.

## 2018-12-18 NOTE — Telephone Encounter (Signed)
Please contact [facility] for scheduling, epidural ordered.

## 2018-12-22 ENCOUNTER — Telehealth: Payer: Self-pay | Admitting: *Deleted

## 2018-12-22 NOTE — Telephone Encounter (Signed)
Pt left vm requesting an order for a 2nd epidural for his back.

## 2018-12-22 NOTE — Telephone Encounter (Signed)
Orders placed already and looks like its been scheduled.

## 2018-12-31 ENCOUNTER — Other Ambulatory Visit: Payer: Self-pay

## 2018-12-31 ENCOUNTER — Ambulatory Visit
Admission: RE | Admit: 2018-12-31 | Discharge: 2018-12-31 | Disposition: A | Discharge status: Home / Self Care | Payer: BC Managed Care – PPO | Source: Ambulatory Visit | Attending: Sports Medicine | Admitting: Sports Medicine

## 2018-12-31 MED ORDER — IOPAMIDOL (ISOVUE-M 200) INJECTION 41%: 1.0000 mL | Freq: Once | INTRAMUSCULAR | Status: DC

## 2018-12-31 MED ORDER — METHYLPREDNISOLONE ACETATE 40 MG/ML INJ SUSP (RADIOLOG: 120.0000 mg | Freq: Once | INTRAMUSCULAR | Status: DC

## 2018-12-31 NOTE — Discharge Instructions (Signed)

## 2019-03-30 ENCOUNTER — Encounter: Payer: Self-pay | Admitting: Sports Medicine

## 2019-03-30 ENCOUNTER — Other Ambulatory Visit: Payer: Self-pay

## 2019-03-30 ENCOUNTER — Ambulatory Visit (INDEPENDENT_AMBULATORY_CARE_PROVIDER_SITE_OTHER): Payer: BC Managed Care – PPO | Admitting: Sports Medicine

## 2019-03-30 DIAGNOSIS — M5416 Radiculopathy, lumbar region: Secondary | ICD-10-CM | POA: Diagnosis not present

## 2019-03-30 NOTE — Assessment & Plan Note (Signed)
Right L5 radiculitis, did not respond much to an S1 epidural, he possibly had partial response to the L5-S1 transforaminal. Repeating L5-S1 epidural but this time interlaminar, I would also like him to get a second opinion from Dr. Lynann Bologna.

## 2019-03-30 NOTE — Progress Notes (Signed)
Subjective:    CC: Low back pain  HPI: This is a pleasant 43 year old male with a fairly large L5-S1 right-sided disc extrusion.  He did have some right-sided L5 radicular symptoms.  After failing physical therapy and other conservative measures we proceeded with an epidural, initially it was a selective S1 block, we proceeded with a L5-S1 transforaminal epidural, both provided some meager relief.  He denies any bowel or bladder dysfunction, physical therapy continues to help but his pain always comes back.  I reviewed the past medical history, family history, social history, surgical history, and allergies today and no changes were needed.  Please see the problem list section below in epic for further details.  Past Medical History: Past Medical History:  Diagnosis Date  . Hyperlipidemia   . Hypertension    Past Surgical History: No past surgical history on file. Social History: Social History   Socioeconomic History  . Marital status: Married    Spouse name: Not on file  . Number of children: Not on file  . Years of education: Not on file  . Highest education level: Not on file  Occupational History  . Not on file  Social Needs  . Financial resource strain: Not on file  . Food insecurity    Worry: Not on file    Inability: Not on file  . Transportation needs    Medical: Not on file    Non-medical: Not on file  Tobacco Use  . Smoking status: Never Smoker  . Smokeless tobacco: Never Used  Substance and Sexual Activity  . Alcohol use: Yes    Comment: 2/4 per week  . Drug use: Never  . Sexual activity: Not on file  Lifestyle  . Physical activity    Days per week: Not on file    Minutes per session: Not on file  . Stress: Not on file  Relationships  . Social Herbalist on phone: Not on file    Gets together: Not on file    Attends religious service: Not on file    Active member of club or organization: Not on file    Attends meetings of clubs or  organizations: Not on file    Relationship status: Not on file  Other Topics Concern  . Not on file  Social History Narrative  . Not on file   Family History: No family history on file. Allergies: No Known Allergies Medications: See med rec.  Review of Systems: No fevers, chills, night sweats, weight loss, chest pain, or shortness of breath.   Objective:    General: Well Developed, well nourished, and in no acute distress.  Neuro: Alert and oriented x3, extra-ocular muscles intact, sensation grossly intact.  HEENT: Normocephalic, atraumatic, pupils equal round reactive to light, neck supple, no masses, no lymphadenopathy, thyroid nonpalpable.  Skin: Warm and dry, no rashes. Cardiac: Regular rate and rhythm, no murmurs rubs or gallops, no lower extremity edema.  Respiratory: Clear to auscultation bilaterally. Not using accessory muscles, speaking in full sentences.  Impression and Recommendations:    Right lumbar radiculitis Right L5 radiculitis, did not respond much to an S1 epidural, he possibly had partial response to the L5-S1 transforaminal. Repeating L5-S1 epidural but this time interlaminar, I would also like him to get a second opinion from Dr. Lynann Bologna.   ___________________________________________ Gwen Her. Dianah Field, M.D., ABFM., CAQSM. Primary Care and Sports Medicine [place] [facility]  Adjunct Professor of [place] of [facility]  of Medicine

## 2019-04-02 ENCOUNTER — Ambulatory Visit
Admission: RE | Admit: 2019-04-02 | Discharge: 2019-04-02 | Disposition: A | Discharge status: Home / Self Care | Payer: BC Managed Care – PPO | Source: Ambulatory Visit | Attending: Sports Medicine | Admitting: Sports Medicine

## 2019-04-02 ENCOUNTER — Other Ambulatory Visit: Payer: Self-pay

## 2019-04-02 MED ORDER — METHYLPREDNISOLONE ACETATE 40 MG/ML INJ SUSP (RADIOLOG
120.0000 mg | Freq: Once | INTRAMUSCULAR | Status: AC
  Administered 2019-04-02: 120 mg via EPIDURAL

## 2019-04-02 MED ORDER — IOPAMIDOL (ISOVUE-M 200) INJECTION 41%
1.0000 mL | Freq: Once | INTRAMUSCULAR | Status: AC
  Administered 2019-04-02: 1 mL via EPIDURAL

## 2019-04-02 NOTE — Discharge Instructions (Signed)

## 2019-09-07 ENCOUNTER — Ambulatory Visit: Payer: BC Managed Care – PPO | Attending: Internal Medicine

## 2019-09-07 DIAGNOSIS — Z23 Encounter for immunization: Secondary | ICD-10-CM

## 2019-09-07 NOTE — Progress Notes (Signed)
   Covid-19 Vaccination Clinic  Name:  Ivan Russell    MRN: [id number] DOB: December 08, 1975  09/07/2019  Ivan Russell was observed post Covid-19 immunization for 15 minutes without incident. He was provided with Vaccine Information Sheet and instruction to access the V-Safe system.   Ivan Russell was instructed to call 911 with any severe reactions post vaccine: Marland Kitchen Difficulty breathing  . Swelling of face and throat  . A fast heartbeat  . A bad rash all over body  . Dizziness and weakness   Immunizations Administered    Name Date Dose VIS Date Route   Pfizer COVID-19 Vaccine 09/07/2019  1:23 PM 0.3 mL 06/12/2019 Intramuscular   Manufacturer: [place]   Lot: [id number]   [place]: [id number]

## 2019-10-07 ENCOUNTER — Ambulatory Visit: Payer: BC Managed Care – PPO | Attending: Internal Medicine

## 2019-10-07 DIAGNOSIS — Z23 Encounter for immunization: Secondary | ICD-10-CM

## 2019-10-07 NOTE — Progress Notes (Signed)
   Covid-19 Vaccination Clinic  Name:  SEIBERT KEETER    MRN: [id number] DOB: 05/30/1976  10/07/2019  Mr. Boomhower was observed post Covid-19 immunization for 15 minutes without incident. He was provided with Vaccine Information Sheet and instruction to access the V-Safe system.   Mr. Hoffmann was instructed to call 911 with any severe reactions post vaccine: Marland Kitchen Difficulty breathing  . Swelling of face and throat  . A fast heartbeat  . A bad rash all over body  . Dizziness and weakness   Immunizations Administered    Name Date Dose VIS Date Route   Pfizer COVID-19 Vaccine 10/07/2019 11:58 AM 0.3 mL 06/12/2019 Intramuscular   Manufacturer: [place]   Lot: [id number]   [place]: [id number]

## 2021-03-15 ENCOUNTER — Encounter: Payer: Self-pay | Admitting: Emergency Medicine

## 2021-03-15 ENCOUNTER — Emergency Department (INDEPENDENT_AMBULATORY_CARE_PROVIDER_SITE_OTHER)
Admission: EM | Admit: 2021-03-15 | Discharge: 2021-03-15 | Disposition: A | Discharge status: Home / Self Care | Payer: BC Managed Care – PPO | Source: Home / Self Care

## 2021-03-15 ENCOUNTER — Other Ambulatory Visit: Payer: Self-pay

## 2021-03-15 DIAGNOSIS — J309 Allergic rhinitis, unspecified: Secondary | ICD-10-CM

## 2021-03-15 DIAGNOSIS — J3489 Other specified disorders of nose and nasal sinuses: Secondary | ICD-10-CM

## 2021-03-15 DIAGNOSIS — J0100 Acute maxillary sinusitis, unspecified: Secondary | ICD-10-CM | POA: Diagnosis not present

## 2021-03-15 MED ORDER — PREDNISONE 20 MG PO TABS: ORAL_TABLET | ORAL | 0 refills | Status: DC

## 2021-03-15 MED ORDER — FEXOFENADINE HCL 180 MG PO TABS: 180.0000 mg | ORAL_TABLET | Freq: Every day | ORAL | 0 refills | Status: AC

## 2021-03-15 MED ORDER — CEFDINIR 300 MG PO CAPS: 300.0000 mg | ORAL_CAPSULE | Freq: Two times a day (BID) | ORAL | 0 refills | Status: AC

## 2021-03-15 NOTE — Discharge Instructions (Addendum)
Advised/instructed patient to take medication as directed with food to completion.  Encouraged patient decrease daily water intake while taking these medications.

## 2021-03-15 NOTE — ED Provider Notes (Signed)
[name]    CSN: [id number] Arrival date & time: 03/15/21  0857      History   Chief Complaint Chief Complaint  Patient presents with   Sinus Problem    HPI Ivan Russell is a 45 y.o. male.   HPI 45 year old male presents with sinus nasal congestion, pressure, pain, bilateral otalgia x 7 days.  Patient is vaccinated for COVID-19.  Past Medical History:  Diagnosis Date   Hyperlipidemia    Hypertension     Patient Active Problem List   Diagnosis Date Noted   Right lumbar radiculitis 09/24/2018   PARONYCHIA, FINGER 10/13/2008    History reviewed. No pertinent surgical history.     Home Medications    Prior to Admission medications   Medication Sig Start Date End Date Taking? Authorizing Provider  cefdinir (OMNICEF) 300 MG capsule Take 1 capsule (300 mg total) by mouth 2 (two) times daily for 7 days. 03/15/21 03/22/21 Yes Eliezer Lofts, FNP  fexofenadine [facility] ALLERGY) 180 MG tablet Take 1 tablet (180 mg total) by mouth daily for 15 days. 03/15/21 03/30/21 Yes Eliezer Lofts, FNP  predniSONE (DELTASONE) 20 MG tablet Take 3 tabs PO x 5 days. 03/15/21  Yes Eliezer Lofts, FNP  lisinopril (PRINIVIL,ZESTRIL) 10 MG tablet Take 10 mg by mouth daily.    [provider]  naproxen (NAPROSYN) 500 MG tablet Take by mouth. 10/31/18   [provider]  omeprazole (PRILOSEC OTC) 20 MG tablet Take by mouth.    [provider]  pravastatin (PRAVACHOL) 40 MG tablet TAKE 1 TABLET BY MOUTH EVERY DAY IN THE EVENING FOR CHOLESTEROL 10/26/18   [provider]    Family History History reviewed. No pertinent family history.  Social History Social History   Tobacco Use   Smoking status: Never   Smokeless tobacco: Never  Vaping Use   Vaping Use: Never used  Substance Use Topics   Alcohol use: Yes    Comment: 2/4 per week   Drug use: Never     Allergies   Patient has no known allergies.   Review of Systems Review of Systems   HENT:  Positive for congestion, postnasal drip, sinus pressure and sinus pain.   All other systems reviewed and are negative.   Physical Exam Triage Vital Signs ED Triage Vitals  Enc Vitals Group     BP 03/15/21 0914 (!) 159/95     Pulse Rate 03/15/21 0914 66     Resp 03/15/21 0914 18     Temp 03/15/21 0914 98.5 F (36.9 C)     Temp Source 03/15/21 0914 Oral     SpO2 03/15/21 0914 96 %     Weight 03/15/21 0916 195 lb (88.5 kg)     Height 03/15/21 0916 5' 9"  (1.753 m)     Head Circumference --      Peak Flow --      Pain Score 03/15/21 0915 4     Pain Loc --      Pain Edu? --      Excl. in [place]? --    No data found.  Updated Vital Signs BP (!) 159/95 (BP Location: Left Arm)   Pulse 66   Temp 98.5 F (36.9 C) (Oral)   Resp 18   Ht 5' 9"  (1.753 m)   Wt 195 lb (88.5 kg)   SpO2 96%   BMI 28.80 kg/m      Physical Exam Vitals and nursing note reviewed.  Constitutional:  Appearance: Normal appearance. He is normal weight.  HENT:     Head: Normocephalic and atraumatic.     Right Ear: Hearing and external ear normal. Tympanic membrane is retracted.     Left Ear: Hearing and external ear normal. Tympanic membrane is retracted.     Ears:     Comments: Moderate eustachian tube dysfunction noted bilaterally    Nose:     Right Turbinates: Enlarged.     Left Turbinates: Enlarged.     Right Sinus: Maxillary sinus tenderness present.     Left Sinus: Maxillary sinus tenderness present.     Comments: Turbinates are erythematous, edematous; TTP over maxillary sinuses bilaterally    Mouth/Throat:     Lips: Pink.     Mouth: Mucous membranes are moist.     Pharynx: Oropharynx is clear. Uvula midline.     Comments: Moderate amount of clear drainage of posterior oropharynx noted Eyes:     Extraocular Movements: Extraocular movements intact.     Conjunctiva/sclera: Conjunctivae normal.     Pupils: Pupils are equal, round, and reactive to light.  Cardiovascular:     Rate and  Rhythm: Normal rate and regular rhythm.     Pulses: Normal pulses.     Heart sounds: Normal heart sounds.  Pulmonary:     Effort: Pulmonary effort is normal.     Breath sounds: Normal breath sounds.     Comments: No adventitious breath sounds noted Musculoskeletal:        General: Normal range of motion.     Cervical back: Normal range of motion and neck supple.  Skin:    General: Skin is warm and dry.  Neurological:     General: No focal deficit present.     Mental Status: He is alert and oriented to person, place, and time. Mental status is at baseline.  Psychiatric:        Mood and Affect: Mood normal.        Behavior: Behavior normal.        Thought Content: Thought content normal.     UC Treatments / Results  Labs (all labs ordered are listed, but only abnormal results are displayed) Labs Reviewed - No data to display  EKG   Radiology No results found.  Procedures Procedures (including critical care time)  Medications Ordered in UC Medications - No data to display  Initial Impression / Assessment and Plan / UC Course  I have reviewed the triage vital signs and the nursing notes.  Pertinent labs & imaging results that were available during my care of the patient were reviewed by me and considered in my medical decision making (see chart for details).     MDM: 1.  Acute maxillary sinusitis, recurrence not specified-Rx'd Cefdinir; 2.  Sinus pressure-Rx'd Prednisone burst; 3.  Allergic rhinitis, unspecified seasonality, unspecified trigger-Rx'd Allegra. Advised/instructed patient to take medication as directed with food to completion.  Encouraged patient decrease daily water intake while taking these medications.  Discharged home, hemodynamically stable. Final Clinical Impressions(s) / UC Diagnoses   Final diagnoses:  Acute maxillary sinusitis, recurrence not specified  Sinus pressure  Allergic rhinitis, unspecified seasonality, unspecified trigger      Discharge Instructions      Advised/instructed patient to take medication as directed with food to completion.  Encouraged patient decrease daily water intake while taking these medications.     ED Prescriptions     Medication Sig Dispense Auth. Provider   cefdinir (OMNICEF) 300 MG capsule Take  1 capsule (300 mg total) by mouth 2 (two) times daily for 7 days. 14 capsule Eliezer Lofts, FNP   predniSONE (DELTASONE) 20 MG tablet Take 3 tabs PO x 5 days. 15 tablet Eliezer Lofts, FNP   fexofenadine [facility] ALLERGY) 180 MG tablet Take 1 tablet (180 mg total) by mouth daily for 15 days. 15 tablet Eliezer Lofts, FNP      PDMP not reviewed this encounter.   Eliezer Lofts, FNP 03/15/21 1200

## 2021-03-15 NOTE — ED Triage Notes (Signed)
Sinus pain, pressure, ears hurt, facial pain x 1 week Vaccinated

## 2021-06-30 ENCOUNTER — Emergency Department (INDEPENDENT_AMBULATORY_CARE_PROVIDER_SITE_OTHER)
Admission: EM | Admit: 2021-06-30 | Discharge: 2021-06-30 | Disposition: A | Discharge status: Home / Self Care | Payer: BC Managed Care – PPO | Source: Home / Self Care

## 2021-06-30 ENCOUNTER — Other Ambulatory Visit: Payer: Self-pay

## 2021-06-30 DIAGNOSIS — M62830 Muscle spasm of back: Secondary | ICD-10-CM

## 2021-06-30 DIAGNOSIS — M5416 Radiculopathy, lumbar region: Secondary | ICD-10-CM

## 2021-06-30 MED ORDER — BACLOFEN 10 MG PO TABS: 10.0000 mg | ORAL_TABLET | Freq: Three times a day (TID) | ORAL | 0 refills | Status: AC

## 2021-06-30 MED ORDER — PREDNISONE 20 MG PO TABS: ORAL_TABLET | ORAL | 0 refills | Status: AC

## 2021-06-30 MED ORDER — METHYLPREDNISOLONE SODIUM SUCC 125 MG IJ SOLR
125.0000 mg | Freq: Once | INTRAMUSCULAR | Status: AC
  Administered 2021-06-30: 12:00:00 125 mg via INTRAMUSCULAR

## 2021-06-30 NOTE — ED Provider Notes (Signed)
[facility]    CSN: [id number] Arrival date & time: 06/30/21  1046      History   Chief Complaint Chief Complaint  Patient presents with   Back Pain    HPI Ivan Russell is a 45 y.o. male.   HPI 45 year old male presents with lower back pain/tightness for 1 month.  Reports little worsening of the past 3 days.  Patient reports predominantly left-sided with a long history of back issues.  PMH is significant for right lumbar radiculitis.  Past Medical History:  Diagnosis Date   Hyperlipidemia    Hypertension     Patient Active Problem List   Diagnosis Date Noted   Right lumbar radiculitis 09/24/2018   PARONYCHIA, FINGER 10/13/2008    History reviewed. No pertinent surgical history.     Home Medications    Prior to Admission medications   Medication Sig Start Date End Date Taking? Authorizing Provider  baclofen (LIORESAL) 10 MG tablet Take 1 tablet (10 mg total) by mouth 3 (three) times daily. 06/30/21  Yes Eliezer Lofts, FNP  predniSONE (DELTASONE) 20 MG tablet Take 3 tabs PO daily x 3 days, then 2 tabs PO daily x 3 days, then 1 tab PO daily x 3 days 06/30/21  Yes Eliezer Lofts, FNP  fexofenadine [place] ALLERGY) 180 MG tablet Take 1 tablet (180 mg total) by mouth daily for 15 days. 03/15/21 03/30/21  Eliezer Lofts, FNP  lisinopril (PRINIVIL,ZESTRIL) 10 MG tablet Take 10 mg by mouth daily.    [provider]  naproxen (NAPROSYN) 500 MG tablet Take by mouth. 10/31/18   [provider]  omeprazole (PRILOSEC OTC) 20 MG tablet Take by mouth.    [provider]  pravastatin (PRAVACHOL) 40 MG tablet TAKE 1 TABLET BY MOUTH EVERY DAY IN THE EVENING FOR CHOLESTEROL 10/26/18   [provider]    Family History History reviewed. No pertinent family history.  Social History Social History   Tobacco Use   Smoking status: Never   Smokeless tobacco: Never  Vaping Use   Vaping Use: Never used  Substance Use Topics   Alcohol  use: Yes    Comment: 2/4 per week   Drug use: Never     Allergies   Patient has no known allergies.   Review of Systems Review of Systems  Musculoskeletal:  Positive for back pain.  All other systems reviewed and are negative.   Physical Exam Triage Vital Signs ED Triage Vitals  Enc Vitals Group     BP 06/30/21 1142 (!) 142/85     Pulse Rate 06/30/21 1142 (!) 52     Resp 06/30/21 1142 17     Temp 06/30/21 1142 98.3 F (36.8 C)     Temp Source 06/30/21 1142 Oral     SpO2 06/30/21 1142 96 %     Weight --      Height --      Head Circumference --      Peak Flow --      Pain Score 06/30/21 1146 8     Pain Loc --      Pain Edu? --      Excl. in [place]? --    No data found.  Updated Vital Signs BP (!) 142/85 (BP Location: Right Arm)    Pulse (!) 52    Temp 98.3 F (36.8 C) (Oral)    Resp 17    SpO2 96%   Physical Exam Vitals and nursing note reviewed.  Constitutional:  General: He is not in acute distress.    Appearance: Normal appearance. He is normal weight. He is not ill-appearing.  HENT:     Head: Normocephalic and atraumatic.     Mouth/Throat:     Mouth: Mucous membranes are moist.     Pharynx: Oropharynx is clear.  Eyes:     Extraocular Movements: Extraocular movements intact.     Conjunctiva/sclera: Conjunctivae normal.     Pupils: Pupils are equal, round, and reactive to light.  Cardiovascular:     Rate and Rhythm: Normal rate and regular rhythm.     Pulses: Normal pulses.     Heart sounds: Normal heart sounds.  Pulmonary:     Effort: Pulmonary effort is normal.     Breath sounds: Normal breath sounds.  Musculoskeletal:     Cervical back: Normal range of motion and neck supple.     Comments: Lumbar sacral spine (left-sided inferior aspect) TTP over left-sided paraspinous muscles and inferior spinal erectors, no deformity noted  Skin:    General: Skin is warm and dry.  Neurological:     General: No focal deficit present.     Mental Status: He is  alert and oriented to person, place, and time. Mental status is at baseline.     UC Treatments / Results  Labs (all labs ordered are listed, but only abnormal results are displayed) Labs Reviewed - No data to display  EKG   Radiology No results found.  Procedures Procedures (including critical care time)  Medications Ordered in UC Medications  methylPREDNISolone sodium succinate (SOLU-MEDROL) 125 mg/2 mL injection 125 mg (125 mg Intramuscular Given 06/30/21 1218)    Initial Impression / Assessment and Plan / UC Course  I have reviewed the triage vital signs and the nursing notes.  Pertinent labs & imaging results that were available during my care of the patient were reviewed by me and considered in my medical decision making (see chart for details).     MDM: 1.  Acute left lumbar radiculopathy-IM Solu-Medrol 125 mg given once in clinic prior to discharge, Rx'd Prednisone taper; 2.  Muscle spasm of back-Rx'd Baclofen-Advised patient to take medication as directed with food to completion.  Patient may take Baclofen daily or as needed for accompanying back muscle spasms.  Directed patient to start oral Prednisone taper tomorrow, Saturday, 07/01/2021.  Encouraged patient to increase daily water intake while taking this medication.  Patient discharged home, hemodynamically stable. Final Clinical Impressions(s) / UC Diagnoses   Final diagnoses:  Acute left lumbar radiculopathy  Muscle spasm of back     Discharge Instructions      Advised patient to take medication as directed with food to completion.  Patient may take Baclofen daily or as needed for accompanying back muscle spasms.  Directed patient to start oral Prednisone taper tomorrow, Saturday, 07/01/2021.  Encouraged patient to increase daily water intake while taking this medication.     ED Prescriptions     Medication Sig Dispense Auth. Provider   predniSONE (DELTASONE) 20 MG tablet Take 3 tabs PO daily x 3 days,  then 2 tabs PO daily x 3 days, then 1 tab PO daily x 3 days 18 tablet Eliezer Lofts, FNP   baclofen (LIORESAL) 10 MG tablet Take 1 tablet (10 mg total) by mouth 3 (three) times daily. 80 each Eliezer Lofts, FNP      PDMP not reviewed this encounter.   Eliezer Lofts, [place] 06/30/21 1230

## 2021-06-30 NOTE — Discharge Instructions (Addendum)
Advised patient to take medication as directed with food to completion.  Patient may take Baclofen daily or as needed for accompanying back muscle spasms.  Directed patient to start oral Prednisone taper tomorrow, Saturday, 07/01/2021.  Encouraged patient to increase daily water intake while taking this medication.

## 2021-06-30 NOTE — ED Triage Notes (Signed)
Pt c/o back pain/tightness x 1 month. Worsening in last couple days. Pain/tightness worse on LT side. Long hx of back issues. Icyhot and tylenol/motrin prn. Pain 8/10

## 2022-07-06 ENCOUNTER — Ambulatory Visit: Admit: 2022-07-06 | Discharge status: Absent without leave | Payer: BC Managed Care – PPO

## 2022-07-06 ENCOUNTER — Ambulatory Visit
Admission: EM | Admit: 2022-07-06 | Discharge: 2022-07-06 | Discharge status: Absent without leave | Payer: BC Managed Care – PPO
